# Patient Record
Sex: Male | Born: 1960
Health system: Southern US, Community
[De-identification: ages and names within clinical notes are randomized; demographics above are authoritative.]

## PROBLEM LIST (undated history)

## (undated) DIAGNOSIS — R519 Headache, unspecified: Secondary | ICD-10-CM

## (undated) DIAGNOSIS — W57XXXA Bitten or stung by nonvenomous insect and other nonvenomous arthropods, initial encounter: Secondary | ICD-10-CM

## (undated) DIAGNOSIS — R51 Headache: Secondary | ICD-10-CM

## (undated) DIAGNOSIS — Z87442 Personal history of urinary calculi: Secondary | ICD-10-CM

## (undated) DIAGNOSIS — M1990 Unspecified osteoarthritis, unspecified site: Secondary | ICD-10-CM

## (undated) HISTORY — PX: THROAT SURGERY: SHX803

## (undated) HISTORY — PX: CHOLECYSTECTOMY: SHX55

---

## 2000-04-17 ENCOUNTER — Emergency Department (HOSPITAL_COMMUNITY): Admission: EM | Admit: 2000-04-17 | Discharge: 2000-04-17 | Payer: Self-pay | Admitting: Emergency Medicine

## 2005-08-29 ENCOUNTER — Emergency Department (HOSPITAL_COMMUNITY): Admission: EM | Admit: 2005-08-29 | Discharge: 2005-08-29 | Payer: Self-pay | Admitting: Emergency Medicine

## 2007-11-08 ENCOUNTER — Emergency Department (HOSPITAL_COMMUNITY): Admission: EM | Admit: 2007-11-08 | Discharge: 2007-11-09 | Payer: Self-pay | Admitting: Emergency Medicine

## 2008-10-22 ENCOUNTER — Emergency Department (HOSPITAL_COMMUNITY): Admission: EM | Admit: 2008-10-22 | Discharge: 2008-10-22 | Payer: Self-pay | Admitting: Family Medicine

## 2010-10-04 LAB — POCT URINALYSIS DIP (DEVICE)
Protein, ur: 30 mg/dL — AB
pH: 5 (ref 5.0–8.0)

## 2012-01-19 ENCOUNTER — Encounter (HOSPITAL_COMMUNITY): Payer: Self-pay | Admitting: *Deleted

## 2012-01-19 ENCOUNTER — Emergency Department (HOSPITAL_COMMUNITY): Payer: Self-pay

## 2012-01-19 ENCOUNTER — Emergency Department (HOSPITAL_COMMUNITY)
Admission: EM | Admit: 2012-01-19 | Discharge: 2012-01-19 | Disposition: A | Discharge status: Home / Self Care | Payer: Self-pay | Attending: Emergency Medicine | Admitting: Emergency Medicine

## 2012-01-19 DIAGNOSIS — S335XXA Sprain of ligaments of lumbar spine, initial encounter: Secondary | ICD-10-CM | POA: Insufficient documentation

## 2012-01-19 DIAGNOSIS — IMO0002 Reserved for concepts with insufficient information to code with codable children: Secondary | ICD-10-CM

## 2012-01-19 DIAGNOSIS — M545 Low back pain, unspecified: Secondary | ICD-10-CM | POA: Insufficient documentation

## 2012-01-19 DIAGNOSIS — F17200 Nicotine dependence, unspecified, uncomplicated: Secondary | ICD-10-CM | POA: Insufficient documentation

## 2012-01-19 DIAGNOSIS — W1789XA Other fall from one level to another, initial encounter: Secondary | ICD-10-CM | POA: Insufficient documentation

## 2012-01-19 MED ORDER — KETOROLAC TROMETHAMINE 60 MG/2ML IM SOLN
60.0000 mg | Freq: Once | INTRAMUSCULAR | Status: AC
  Administered 2012-01-19: 60 mg via INTRAMUSCULAR
  Filled 2012-01-19: qty 2

## 2012-01-19 MED ORDER — DIAZEPAM 5 MG PO TABS
10.0000 mg | ORAL_TABLET | Freq: Once | ORAL | Status: AC
  Administered 2012-01-19: 10 mg via ORAL
  Filled 2012-01-19: qty 2

## 2012-01-19 MED ORDER — OXYCODONE-ACETAMINOPHEN 7.5-325 MG PO TABS: 1.0000 | ORAL_TABLET | ORAL | Status: AC | PRN

## 2012-01-19 MED ORDER — DIAZEPAM 5 MG PO TABS: 5.0000 mg | ORAL_TABLET | ORAL | Status: AC | PRN

## 2012-01-19 MED ORDER — OXYCODONE-ACETAMINOPHEN 5-325 MG PO TABS
2.0000 | ORAL_TABLET | Freq: Once | ORAL | Status: AC
  Administered 2012-01-19: 2 via ORAL
  Filled 2012-01-19: qty 2

## 2012-01-19 NOTE — ED Notes (Signed)
Patient given discharge instructions, information, prescriptions, and diet order. Patient states that they adequately understand discharge information given and to return to ED if symptoms return or worsen.     

## 2012-01-19 NOTE — ED Provider Notes (Signed)
History     CSN: [id number]  Arrival date & time 01/19/12  1913   First MD Initiated Contact with Patient 01/19/12 2026      Chief Complaint  Patient presents with  . Fall    > 10 feet from telephone pole    (Consider location/radiation/quality/duration/timing/severity/associated sxs/prior treatment) Patient is a 51 y.o. male presenting with fall. The history is provided by the patient and the spouse.  Fall   Patient fell 10 feet from a telephone pole 3 days ago and landed on his feet on a grassy surface. No loss of consciousness. Denies any head or neck chest or abdomen injury. Complains of pain to his lower lumbar spine described as sharp and worse with walking. Does note some radicular pain down his right leg. No change in bowel or bladder function. Patient is able to ablate without dragging his foot. Has been using over-the-counter medications without relief History reviewed. No pertinent past medical history.  Past Surgical History  Procedure Date  . Shoulder surgery     Removed ligament in L upper arm    History reviewed. No pertinent family history.  History  Substance Use Topics  . Smoking status: Current Everyday Smoker -- 1.5 packs/day    Types: Cigarettes  . Smokeless tobacco: Not on file  . Alcohol Use: Yes     daily      Review of Systems  All other systems reviewed and are negative.    Allergies  Review of patient's allergies indicates no known allergies.  Home Medications  No current outpatient prescriptions on file.  BP 144/79  Pulse 67  Temp 98.3 F (36.8 C) (Oral)  Resp 20  SpO2 98%  Physical Exam  Nursing note and vitals reviewed. Constitutional: He is oriented to person, place, and time. He appears well-developed and well-nourished.  Non-toxic appearance. No distress.  HENT:  Head: Normocephalic and atraumatic.  Eyes: Conjunctivae, EOM and lids are normal. Pupils are equal, round, and reactive to light.  Neck: Normal range of  motion. Neck supple. No tracheal deviation present. No mass present.  Cardiovascular: Normal rate, regular rhythm and normal heart sounds.  Exam reveals no gallop.   No murmur heard. Pulmonary/Chest: Effort normal and breath sounds normal. No stridor. No respiratory distress. He has no decreased breath sounds. He has no wheezes. He has no rhonchi. He has no rales.  Abdominal: Soft. Normal appearance and bowel sounds are normal. He exhibits no distension. There is no tenderness. There is no rebound and no CVA tenderness.  Musculoskeletal: Normal range of motion. He exhibits no edema and no tenderness.       Right shoulder: He exhibits pain and spasm.       Arms: Neurological: He is alert and oriented to person, place, and time. He has normal strength. No cranial nerve deficit or sensory deficit. GCS eye subscore is 4. GCS verbal subscore is 5. GCS motor subscore is 6.  Skin: Skin is warm and dry. No abrasion and no rash noted.  Psychiatric: He has a normal mood and affect. His speech is normal and behavior is normal.    ED Course  Procedures (including critical care time)  Labs Reviewed - No data to display No results found.   No diagnosis found.    MDM  Pt given pain meds and feels better--repeat neuro exam and pt able to ambulate        Toy Baker, MD 01/19/12 2300

## 2012-01-19 NOTE — ED Notes (Signed)
Patient ambulated independently to restroom. Slight limp.

## 2012-01-19 NOTE — ED Notes (Signed)
Pt sts he fell on Thursday from 15 feet high at work. Patient sts he was given pain pill from manager and told to go back to work. Has had pain in his lower back since Thursday, walking with limp and holding his back.

## 2012-01-19 NOTE — ED Notes (Signed)
Pt states he fell from halfway up a telephone pole landing on both feet. Pt was at work at the time. Pt presents w/ c/o lower back pain radiating to both legs, mostly the L leg. Pt c/o pain worse when changing positions and change in mobility. Pt also c/o slight pain in suprapubic area.

## 2016-10-15 ENCOUNTER — Encounter (HOSPITAL_COMMUNITY): Payer: Self-pay | Admitting: Emergency Medicine

## 2016-10-15 ENCOUNTER — Emergency Department (HOSPITAL_COMMUNITY)
Admission: EM | Admit: 2016-10-15 | Discharge: 2016-10-15 | Disposition: A | Discharge status: Home / Self Care | Payer: Self-pay | Attending: Emergency Medicine | Admitting: Emergency Medicine

## 2016-10-15 ENCOUNTER — Emergency Department (HOSPITAL_COMMUNITY): Payer: Self-pay

## 2016-10-15 DIAGNOSIS — F17210 Nicotine dependence, cigarettes, uncomplicated: Secondary | ICD-10-CM | POA: Insufficient documentation

## 2016-10-15 DIAGNOSIS — N3001 Acute cystitis with hematuria: Secondary | ICD-10-CM | POA: Insufficient documentation

## 2016-10-15 DIAGNOSIS — N200 Calculus of kidney: Secondary | ICD-10-CM | POA: Insufficient documentation

## 2016-10-15 LAB — CBC
HCT: 43.5 % (ref 39.0–52.0)
Hemoglobin: 14.4 g/dL (ref 13.0–17.0)
MCH: 30.8 pg (ref 26.0–34.0)
MCHC: 33.1 g/dL (ref 30.0–36.0)
MCV: 93.1 fL (ref 78.0–100.0)
PLATELETS: 331 10*3/uL (ref 150–400)
RBC: 4.67 MIL/uL (ref 4.22–5.81)
RDW: 13.5 % (ref 11.5–15.5)
WBC: 6.1 10*3/uL (ref 4.0–10.5)

## 2016-10-15 LAB — BASIC METABOLIC PANEL
ANION GAP: 8 (ref 5–15)
BUN: 13 mg/dL (ref 6–20)
CALCIUM: 9.3 mg/dL (ref 8.9–10.3)
CO2: 27 mmol/L (ref 22–32)
CREATININE: 0.98 mg/dL (ref 0.61–1.24)
Chloride: 100 mmol/L — ABNORMAL LOW (ref 101–111)
GFR calc Af Amer: >60 mL/min (ref >60)
GLUCOSE: 98 mg/dL (ref 65–99)
Potassium: 3.5 mmol/L (ref 3.5–5.1)
Sodium: 135 mmol/L (ref 135–145)

## 2016-10-15 LAB — URINALYSIS, ROUTINE W REFLEX MICROSCOPIC
BILIRUBIN URINE: NEGATIVE
Glucose, UA: NEGATIVE mg/dL
Ketones, ur: NEGATIVE mg/dL
NITRITE: NEGATIVE
Protein, ur: 30 mg/dL — AB
SPECIFIC GRAVITY, URINE: 1.011 (ref 1.005–1.030)
Squamous Epithelial / LPF: NONE SEEN
pH: 5 (ref 5.0–8.0)

## 2016-10-15 MED ORDER — ACETAMINOPHEN 325 MG PO TABS
650.0000 mg | ORAL_TABLET | Freq: Once | ORAL | Status: AC
  Administered 2016-10-15: 650 mg via ORAL
  Filled 2016-10-15: qty 2

## 2016-10-15 MED ORDER — OXYCODONE-ACETAMINOPHEN 5-325 MG PO TABS: 1.0000 | ORAL_TABLET | Freq: Four times a day (QID) | ORAL | 0 refills | Status: DC | PRN

## 2016-10-15 MED ORDER — CIPROFLOXACIN HCL 250 MG PO TABS: 250.0000 mg | ORAL_TABLET | Freq: Two times a day (BID) | ORAL | 0 refills | Status: AC

## 2016-10-15 NOTE — ED Provider Notes (Signed)
[facility] DEPT Provider Note   CSN: [id number] Arrival date & time: 10/15/16  1405     History   Chief Complaint Chief Complaint  Patient presents with  . Nephrolithiasis    HPI Jose Brooks is a 56 y.o. male presents today with complaints of right flank pain starting 1 and half week. He reports associated hematuria. He reports his pain as pressure, constant, 10/10. He reports taking percocet with moderate relief.  He states his pain is worse when he has to urinate.  He denies fever, chills, nausea, vomiting, diarrhea. He denies previous hx of kidney stone. He reports he has had his gallbladder removed. He states he was diagnosed with kidney stone Saturday of last week in [place] while he was out of town working. He states he went back to hospital and was told it was not in the right place and he would need further evaluation.   The history is provided by the patient. No language interpreter was used.    History reviewed. No pertinent past medical history.  There are no active problems to display for this patient.   Past Surgical History:  Procedure Laterality Date  . SHOULDER SURGERY     Removed ligament in L upper arm       Home Medications    Prior to Admission medications   Medication Sig Start Date End Date Taking? Authorizing Provider  ciprofloxacin (CIPRO) 250 MG tablet Take 1 tablet (250 mg total) by mouth 2 (two) times daily. 10/15/16 10/18/16  Lyan Moyano Manuel [place], [place]  oxyCODONE-acetaminophen (PERCOCET/ROXICET) 5-325 MG tablet Take 1 tablet by mouth every 6 (six) hours as needed for severe pain. 10/15/16   Ivo Moga Mathews [place], [place]    Family History No family history on file.  Social History Social History  Substance Use Topics  . Smoking status: Current Every Day Smoker    Packs/day: 1.50    Types: Cigarettes  . Smokeless tobacco: Not on file  . Alcohol use Yes     Comment: daily     Allergies   Patient has no known  allergies.   Review of Systems Review of Systems  Constitutional: Negative for chills and fever.  Respiratory: Negative for shortness of breath.   Cardiovascular: Negative for chest pain.  Gastrointestinal: Negative for abdominal pain, diarrhea, nausea and vomiting.  Genitourinary: Positive for flank pain and hematuria. Negative for discharge, penile pain and penile swelling.  All other systems reviewed and are negative.    Physical Exam Updated Vital Signs BP (!) 149/93   Pulse 64   Temp 97.8 F (36.6 C) (Oral)   Resp 18   SpO2 100%   Physical Exam  Constitutional: He appears well-developed and well-nourished.  Well appearing  HENT:  Head: Normocephalic and atraumatic.  Nose: Nose normal.  Mouth/Throat: Oropharynx is clear and moist.  Eyes: Conjunctivae and EOM are normal.  Neck: Normal range of motion.  Cardiovascular: Normal rate, normal heart sounds and intact distal pulses.   No murmur heard. Pulmonary/Chest: Effort normal and breath sounds normal. No respiratory distress. He has no wheezes. He has no rales.  Normal work of breathing. No respiratory distress noted.   Abdominal: Soft. Bowel sounds are normal. There is no tenderness. There is no rebound and no guarding.  Negative murphy's sign. No focal tenderness to Mcburney's point. Positive CVA tenderness to right side. Negative CVA tenderness to left side.   Genitourinary:  Genitourinary Comments: Foley catheter in place. No signs of erythema, discharge. No  evidence of infectious process  Musculoskeletal: Normal range of motion.  Neurological: He is alert.  Skin: Skin is warm. Capillary refill takes less than 2 seconds.  Psychiatric: He has a normal mood and affect. His behavior is normal.  Nursing note and vitals reviewed.    ED Treatments / Results  Labs (all labs ordered are listed, but only abnormal results are displayed) Labs Reviewed  URINALYSIS, ROUTINE W REFLEX MICROSCOPIC - Abnormal; Notable for the  following:       Result Value   APPearance HAZY (*)    Hgb urine dipstick LARGE (*)    Protein, ur 30 (*)    Leukocytes, UA MODERATE (*)    Bacteria, UA RARE (*)    All other components within normal limits  BASIC METABOLIC PANEL - Abnormal; Notable for the following:    Chloride 100 (*)    All other components within normal limits  URINE CULTURE  CBC    EKG  EKG Interpretation None       Radiology Ct Renal Stone Study  Result Date: 10/15/2016 CLINICAL DATA:  Right-sided flank pain and hematuria. Recent stent placement. EXAM: CT ABDOMEN AND PELVIS WITHOUT CONTRAST TECHNIQUE: Multidetector CT imaging of the abdomen and pelvis was performed following the standard protocol without IV contrast. COMPARISON:  None. FINDINGS: Lower chest: Normal Hepatobiliary: Liver appears normal.  Previous cholecystectomy. Pancreas: Normal Spleen: Normal Adrenals/Urinary Tract: Adrenal glands are normal. 2 mm nonobstructing stone in the upper pole the left kidney. Double-J ureteral stent in place on the right, grossly well positioned. Nonobstructing 1 mm stone in the lower pole the right kidney. 4 mm stone in the extra renal pelvis adjacent to the proximal stent. 2 mm stone dependent in the bladder. Catheter in place within the bladder as well. Stomach/Bowel: Normal Vascular/Lymphatic: Aortic atherosclerosis. No aneurysm. IVC is normal. No retroperitoneal mass or lymphadenopathy. Reproductive: Normal Other: No free fluid or air. Musculoskeletal: Chronic lower lumbar degenerative changes. IMPRESSION: Double-J ureteral stent on the right grossly well positioned. 4 mm stone adjacent to the stent in the extra renal pelvis. 2 mm stone in the bladder. Nonobstructing 1 mm stone lower pole right kidney and 2 mm stone upper pole left kidney. Aortic atherosclerosis. Previous cholecystectomy. Lower lumbar degenerative changes. Electronically Signed   By: Nelson Chimes M.D.   On: 10/15/2016 18:15    Procedures Procedures  (including critical care time)  Medications Ordered in ED Medications  acetaminophen (TYLENOL) tablet 650 mg (650 mg Oral Given 10/15/16 1703)     Initial Impression / Assessment and Plan / ED Course  I have reviewed the triage vital signs and the nursing notes.  Pertinent labs & imaging results that were available during my care of the patient were reviewed by me and considered in my medical decision making (see chart for details).    Pt has been diagnosed with a Kidney Stone via CT. Patient's double J ureteral stent on the right side well positioned via CT. There is no evidence of significant hydronephrosis, serum creatine WNL, vitals sign stable and the pt does not have intractable vomiting. Pt will be dc home with pain medications and has been advised to follow up with urologist and keep foley in place until seeing urologist. Urinalysis also showed evidence of possible urinary tract infection. Patient will be given Cipro, and urine culture sent. Patient case discussed with Dr. Vallery Ridge who agrees with assessment and plan. Reasons to immediately return to ED discussed. Patient verbally understood and agreed with assessment  and plan.   Final Clinical Impressions(s) / ED Diagnoses   Final diagnoses:  Nephrolithiasis  Acute cystitis with hematuria    New Prescriptions New Prescriptions   CIPROFLOXACIN (CIPRO) 250 MG TABLET    Take 1 tablet (250 mg total) by mouth 2 (two) times daily.   OXYCODONE-ACETAMINOPHEN (PERCOCET/ROXICET) 5-325 MG TABLET    Take 1 tablet by mouth every 6 (six) hours as needed for severe pain.     [place], [place] 10/15/16 1844    Charlesetta Shanks, MD 10/16/16 0025

## 2016-10-15 NOTE — Discharge Instructions (Signed)
Please take Percocet as needed for severe pain. Please take Cipro twice a day for 3 days. It is important that he stick with the antibiotic regimen and that you complete the course. Please follow up with urology, call tomorrow to make an appointment regarding today's visit.  Contact a health care provider if: You have pain that gets worse or does not get better with medicine. Get help right away if: You have a fever or chills. You develop severe pain. You develop new abdominal pain. You faint. You are unable to urinate.

## 2016-10-15 NOTE — ED Notes (Signed)
Asked PA for update

## 2016-10-15 NOTE — ED Notes (Signed)
Pt stable, ambulatory, states understanding of discharge instructions 

## 2016-10-15 NOTE — ED Triage Notes (Signed)
Pt reports diagnosed with kidney stone and had stent and foley catheter placed in [place] while he was out of town working, states he was told stent was not in the right place and he needed to follow up for further evaluation.

## 2016-10-16 LAB — URINE CULTURE

## 2016-10-22 ENCOUNTER — Other Ambulatory Visit: Payer: Self-pay | Admitting: Urology

## 2016-10-24 ENCOUNTER — Emergency Department (HOSPITAL_COMMUNITY): Payer: Self-pay

## 2016-10-24 ENCOUNTER — Encounter (HOSPITAL_COMMUNITY): Payer: Self-pay | Admitting: *Deleted

## 2016-10-24 ENCOUNTER — Emergency Department (HOSPITAL_COMMUNITY)
Admission: EM | Admit: 2016-10-24 | Discharge: 2016-10-24 | Disposition: A | Discharge status: Home / Self Care | Payer: Self-pay | Attending: Emergency Medicine | Admitting: Emergency Medicine

## 2016-10-24 DIAGNOSIS — N390 Urinary tract infection, site not specified: Secondary | ICD-10-CM | POA: Insufficient documentation

## 2016-10-24 DIAGNOSIS — R319 Hematuria, unspecified: Secondary | ICD-10-CM | POA: Insufficient documentation

## 2016-10-24 DIAGNOSIS — N320 Bladder-neck obstruction: Secondary | ICD-10-CM | POA: Insufficient documentation

## 2016-10-24 DIAGNOSIS — F17210 Nicotine dependence, cigarettes, uncomplicated: Secondary | ICD-10-CM | POA: Insufficient documentation

## 2016-10-24 LAB — CBC WITH DIFFERENTIAL/PLATELET
Basophils Absolute: 0 10*3/uL (ref 0.0–0.1)
Basophils Relative: 0 %
Eosinophils Absolute: 0.1 10*3/uL (ref 0.0–0.7)
Eosinophils Relative: 0 %
HEMATOCRIT: 44.7 % (ref 39.0–52.0)
HEMOGLOBIN: 15.1 g/dL (ref 13.0–17.0)
LYMPHS ABS: 2 10*3/uL (ref 0.7–4.0)
LYMPHS PCT: 15 %
MCH: 30.6 pg (ref 26.0–34.0)
MCHC: 33.8 g/dL (ref 30.0–36.0)
MCV: 90.5 fL (ref 78.0–100.0)
MONO ABS: 3.3 10*3/uL — AB (ref 0.1–1.0)
MONOS PCT: 25 %
NEUTROS ABS: 7.9 10*3/uL — AB (ref 1.7–7.7)
NEUTROS PCT: 60 %
Platelets: 271 10*3/uL (ref 150–400)
RBC: 4.94 MIL/uL (ref 4.22–5.81)
RDW: 13.3 % (ref 11.5–15.5)
WBC: 13.3 10*3/uL — ABNORMAL HIGH (ref 4.0–10.5)

## 2016-10-24 LAB — URINALYSIS, ROUTINE W REFLEX MICROSCOPIC
Bilirubin Urine: NEGATIVE
GLUCOSE, UA: NEGATIVE mg/dL
Ketones, ur: NEGATIVE mg/dL
NITRITE: NEGATIVE
PROTEIN: 30 mg/dL — AB
Specific Gravity, Urine: 1.013 (ref 1.005–1.030)
pH: 6 (ref 5.0–8.0)

## 2016-10-24 LAB — I-STAT CG4 LACTIC ACID, ED
Lactic Acid, Venous: 1.25 mmol/L (ref 0.5–1.9)
Lactic Acid, Venous: 0.85 mmol/L (ref 0.5–1.9)

## 2016-10-24 LAB — COMPREHENSIVE METABOLIC PANEL
ALK PHOS: 69 U/L (ref 38–126)
ALT: 12 U/L — AB (ref 17–63)
AST: 16 U/L (ref 15–41)
Albumin: 3.5 g/dL (ref 3.5–5.0)
Anion gap: 8 (ref 5–15)
BILIRUBIN TOTAL: 0.9 mg/dL (ref 0.3–1.2)
BUN: 10 mg/dL (ref 6–20)
CO2: 27 mmol/L (ref 22–32)
CREATININE: 1.19 mg/dL (ref 0.61–1.24)
Calcium: 9.2 mg/dL (ref 8.9–10.3)
Chloride: 95 mmol/L — ABNORMAL LOW (ref 101–111)
Glucose, Bld: 95 mg/dL (ref 65–99)
Potassium: 4.1 mmol/L (ref 3.5–5.1)
Sodium: 130 mmol/L — ABNORMAL LOW (ref 135–145)
TOTAL PROTEIN: 7.8 g/dL (ref 6.5–8.1)

## 2016-10-24 MED ORDER — ACETAMINOPHEN 325 MG PO TABS
ORAL_TABLET | ORAL | Status: AC
  Filled 2016-10-24: qty 2

## 2016-10-24 MED ORDER — DOXYCYCLINE HYCLATE 100 MG PO TABS
100.0000 mg | ORAL_TABLET | Freq: Once | ORAL | Status: AC
  Administered 2016-10-24: 100 mg via ORAL
  Filled 2016-10-24: qty 1

## 2016-10-24 MED ORDER — SULFAMETHOXAZOLE-TRIMETHOPRIM 800-160 MG PO TABS: 1.0000 | ORAL_TABLET | Freq: Two times a day (BID) | ORAL | 0 refills | Status: AC

## 2016-10-24 MED ORDER — OXYCODONE-ACETAMINOPHEN 5-325 MG PO TABS
1.0000 | ORAL_TABLET | ORAL | Status: DC | PRN
  Administered 2016-10-24: 1 via ORAL
  Filled 2016-10-24: qty 1

## 2016-10-24 MED ORDER — DEXTROSE 5 % IV SOLN
1.0000 g | INTRAVENOUS | Status: DC
  Administered 2016-10-24: 1 g via INTRAVENOUS
  Filled 2016-10-24: qty 10

## 2016-10-24 MED ORDER — DOXYCYCLINE HYCLATE 50 MG PO CAPS: 50.0000 mg | ORAL_CAPSULE | Freq: Two times a day (BID) | ORAL | 0 refills | Status: AC

## 2016-10-24 MED ORDER — ACETAMINOPHEN 325 MG PO TABS
650.0000 mg | ORAL_TABLET | Freq: Once | ORAL | Status: AC
  Administered 2016-10-24: 650 mg via ORAL

## 2016-10-24 MED ORDER — HYDROCODONE-ACETAMINOPHEN 5-325 MG PO TABS: 1.0000 | ORAL_TABLET | Freq: Four times a day (QID) | ORAL | 0 refills | Status: DC | PRN

## 2016-10-24 NOTE — ED Provider Notes (Signed)
[facility] DEPT Provider Note   CSN: [id number] Arrival date & time: 10/24/16  1717     History   Chief Complaint Chief Complaint  Patient presents with  . Fever    HPI Jose Brooks is a 56 y.o. male with history of ureteral stent and nephrolithiasis who presents with 1 day of fevers, chills, dysuria, and straining to micturate.  He was seen in the ED 1 week ago with nephrolithiasis.  Discharged home with foley catheter, pain meds, and urology follow-up.  Saw urologist at [facility] and was scheduled for a procedure.  As instructed, he returned yesterday for a nurse visit and his foley catheter was removed.  Since that time he has had worsening dysuria and has been straining to void small volumes.  Has had fevers to 101 oral at home last night, and intermittent chills for the past week.  Completed course of Cipro a few days ago; had been taking once a day instead of twice a day for the first part of the course.  He is scheduled for cystoscopy and stent exchange on 5/7 by urologist Dr Pilar Jarvis.  HPI  History reviewed. No pertinent past medical history.  There are no active problems to display for this patient.   Past Surgical History:  Procedure Laterality Date  . CHOLECYSTECTOMY    . SHOULDER SURGERY     Removed ligament in L upper arm      Home Medications    Prior to Admission medications   Medication Sig Start Date End Date Taking? Authorizing Provider  doxycycline (VIBRAMYCIN) 50 MG capsule Take 1 capsule (50 mg total) by mouth 2 (two) times daily. 10/24/16 11/03/16  Minus Liberty, MD  HYDROcodone-acetaminophen (NORCO) 5-325 MG tablet Take 1 tablet by mouth every 6 (six) hours as needed for moderate pain. 10/24/16   Minus Liberty, MD  oxyCODONE-acetaminophen (PERCOCET/ROXICET) 5-325 MG tablet Take 1 tablet by mouth every 6 (six) hours as needed for severe pain. 10/15/16   Francisco Manuel [place], [place]  sulfamethoxazole-trimethoprim (BACTRIM DS,SEPTRA DS) 800-160  MG tablet Take 1 tablet by mouth 2 (two) times daily. 10/24/16 11/03/16  Minus Liberty, MD    Family History Family History  Problem Relation Age of Onset  . Nephrolithiasis Brother     Social History Social History  Substance Use Topics  . Smoking status: Current Every Day Smoker    Packs/day: 1.50    Types: Cigarettes  . Smokeless tobacco: Never Used  . Alcohol use Yes     Comment: daily     Allergies   Patient has no known allergies.   Review of Systems Review of Systems  Constitutional: Positive for chills and fever.  HENT: Negative for congestion and rhinorrhea.   Respiratory: Negative for cough, chest tightness and shortness of breath.   Cardiovascular: Negative for chest pain and palpitations.  Gastrointestinal: Positive for constipation. Negative for diarrhea, nausea and vomiting.  Genitourinary: Positive for difficulty urinating, dysuria, flank pain and hematuria.  Musculoskeletal: Negative for arthralgias and myalgias.  Neurological: Negative for dizziness and headaches.     Physical Exam Updated Vital Signs BP (!) 153/84   Pulse 88   Temp 98.7 F (37.1 C) (Oral)   Resp 20   Ht 5\' 11"  (1.803 m)   Wt 70.3 kg   SpO2 99%   BMI 21.62 kg/m   Physical Exam  Constitutional: He is oriented to person, place, and time. He appears well-developed and well-nourished. No distress.  HENT:  Mouth/Throat: Oropharynx is clear and  moist.  Eyes: Conjunctivae are normal. No scleral icterus.  Neck: Normal range of motion. Neck supple.  Cardiovascular: Normal rate and regular rhythm.   Pulmonary/Chest: Effort normal and breath sounds normal.  Abdominal: Soft. He exhibits no distension. There is no tenderness.  Genitourinary:  Genitourinary Comments: R > L CVA tenderness No SP tenderness  Musculoskeletal: He exhibits no edema or tenderness.  Neurological: He is alert and oriented to person, place, and time.  Skin: Skin is warm and dry.  Psychiatric: He has a  normal mood and affect. His behavior is normal.     ED Treatments / Results  Labs (all labs ordered are listed, but only abnormal results are displayed) Labs Reviewed  COMPREHENSIVE METABOLIC PANEL - Abnormal; Notable for the following:       Result Value   Sodium 130 (*)    Chloride 95 (*)    ALT 12 (*)    All other components within normal limits  CBC WITH DIFFERENTIAL/PLATELET - Abnormal; Notable for the following:    WBC 13.3 (*)    Neutro Abs 7.9 (*)    Monocytes Absolute 3.3 (*)    All other components within normal limits  URINALYSIS, ROUTINE W REFLEX MICROSCOPIC - Abnormal; Notable for the following:    APPearance HAZY (*)    Hgb urine dipstick LARGE (*)    Protein, ur 30 (*)    Leukocytes, UA SMALL (*)    Bacteria, UA RARE (*)    Squamous Epithelial / LPF 0-5 (*)    All other components within normal limits  CULTURE, BLOOD (ROUTINE X 2)  CULTURE, BLOOD (ROUTINE X 2)  URINE CULTURE  I-STAT CG4 LACTIC ACID, ED  I-STAT CG4 LACTIC ACID, ED    EKG  EKG Interpretation None       Radiology Dg Chest 2 View  Result Date: 10/24/2016 CLINICAL DATA:  Fever.  Neurology symptoms. EXAM: CHEST  2 VIEW COMPARISON:  08/29/2005 FINDINGS: The heart size and mediastinal contours are within normal limits. Both lungs are clear. No pleural effusion or pneumothorax. The visualized skeletal structures are unremarkable. IMPRESSION: No active cardiopulmonary disease. Electronically Signed   By: Lajean Manes M.D.   On: 10/24/2016 18:38    Procedures Procedures (including critical care time)  Medications Ordered in ED Medications  acetaminophen (TYLENOL) 325 MG tablet (not administered)  oxyCODONE-acetaminophen (PERCOCET/ROXICET) 5-325 MG per tablet 1 tablet (1 tablet Oral Given 10/24/16 2004)  cefTRIAXone (ROCEPHIN) 1 g in dextrose 5 % 50 mL IVPB (0 g Intravenous Stopped 10/24/16 2217)  acetaminophen (TYLENOL) tablet 650 mg (650 mg Oral Given 10/24/16 1820)  doxycycline (VIBRA-TABS)  tablet 100 mg (100 mg Oral Given 10/24/16 2217)     Initial Impression / Assessment and Plan / ED Course  I have reviewed the triage vital signs and the nursing notes.  Pertinent labs & imaging results that were available during my care of the patient were reviewed by me and considered in my medical decision making (see chart for details).  Patient with known nephrolithiasis and bladder stones and worsening dysuria, fevers, and straining to void after foley catheter was removed yesterday.  Overall consistent with calculous bladder outlet obstruction and possible UTI with fevers and mild leukocytosis.  Renal function is normal, no lactic acidosis, not septic appearing and hemodynamically stable. -Check bladder scan, anticipate placing foley -Pain management -urology consult -ceftriaxone  Clinical Course as of Oct 25 2246  Wed Oct 24, 2016  1928 Discussed case on phone with urologist Dr  Dahlstedt.  Recommend repeat urine culture, place 87F foley, oral Abx, and discharge if stable.  [facility] will call pt tomorrow to arrange follow-up and reschedule procedure.  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  Will add doxycycline to cover additional possible tick-borne illness.  [MO]  2053 UA with pyuria, hematuria, bacteruria consistent with infection  [MO]  2247 Feels relief of pain and pressure with foley in place.  Hungry, ready to go home.  [MO]    Clinical Course User Index [MO] Minus Liberty, MD     Final Clinical Impressions(s) / ED Diagnoses   Final diagnoses:  Bladder outlet obstruction  Urinary tract infection with hematuria, site unspecified   UTI with concern for bladder outlet obstruction in patient with known calculi.  Discharge home with oral antibiotics, foley catheter, and close urology follow-up.   New Prescriptions New Prescriptions   DOXYCYCLINE (VIBRAMYCIN) 50 MG  CAPSULE    Take 1 capsule (50 mg total) by mouth 2 (two) times daily.   HYDROCODONE-ACETAMINOPHEN (NORCO) 5-325 MG TABLET    Take 1 tablet by mouth every 6 (six) hours as needed for moderate pain.   SULFAMETHOXAZOLE-TRIMETHOPRIM (BACTRIM DS,SEPTRA DS) 800-160 MG TABLET    Take 1 tablet by mouth 2 (two) times daily.     Minus Liberty, MD 10/24/16 2248    Duffy Bruce, MD 10/26/16 [phone]

## 2016-10-24 NOTE — ED Notes (Signed)
Pt noticed fever last night, sts its been on and off for about a week.

## 2016-10-24 NOTE — ED Notes (Signed)
Pt sts he has a stent placed in his right kidney due to kidney stones. Pt scheduled for surgery on Monday. Pt not sure exactly what kind of surgery but they are to remove the stones. Pt had catheter placed April 13th along with stent and removed yesterday.

## 2016-10-24 NOTE — ED Notes (Signed)
Bladder scan 434 mL.

## 2016-10-24 NOTE — ED Notes (Signed)
This RN and Charles EMT at bedside to insert foley

## 2016-10-24 NOTE — Discharge Instructions (Signed)
I have prescribed you two antibiotics; take each twice daily for the next 10 days.  Leave the foley catheter in place until you see the urologist.  [facility] will call you tomorrow to schedule follow-up.  If you do not hear from them, please call their office.  If you have worsening fevers, chills, back pain, nausea, or are unable to eat and drink please come back to the ED.

## 2016-10-24 NOTE — ED Notes (Signed)
Pt here for eval for kidney stones, pt reports foley removed yesterday by [facility], pt has known stone & urology procedure scheduled for next Monday per pt, pt reports oliguria, dysuria, & fever, A&O x4

## 2016-10-24 NOTE — ED Notes (Signed)
Pt provided with leg bag and education on how to use it. Pt verbalized understanding and has no further questions

## 2016-10-26 LAB — URINE CULTURE: Culture: 60000 — AB

## 2016-10-26 NOTE — Progress Notes (Signed)
Attempted to reach patient on 5/2 and 5/3 and 10/26/2016 at both phone numbers.  Unsuccessful attempts.  Made connie Mabe at [facility] aware of on 10/25/2016 along with 10/26/2016.  Left her a VMM.  I stated in message that patient would need to contact us for preopinfo.

## 2016-10-27 ENCOUNTER — Telehealth: Payer: Self-pay

## 2016-10-27 NOTE — Progress Notes (Signed)
ED Antimicrobial Stewardship Positive Culture Follow Up   Jose Brooks is an 56 y.o. male who presented to [facility] on 10/24/2016 with a chief complaint of  Chief Complaint  Patient presents with  . Fever    Recent Results (from the past 720 hour(s))  Urine culture     Status: Abnormal   Collection Time: 10/15/16  5:02 PM  Result Value Ref Range Status   Specimen Description URINE, CLEAN CATCH  Final   Special Requests NONE  Final   Culture MULTIPLE SPECIES PRESENT, SUGGEST RECOLLECTION (A)  Final   Report Status 10/16/2016 FINAL  Final  Culture, blood (Routine x 2)     Status: None (Preliminary result)   Collection Time: 10/24/16  8:03 PM  Result Value Ref Range Status   Specimen Description BLOOD LEFT FOREARM  Final   Special Requests   Final    BOTTLES DRAWN AEROBIC AND ANAEROBIC Blood Culture adequate volume   Culture NO GROWTH 2 DAYS  Final   Report Status PENDING  Incomplete  Urine culture     Status: Abnormal   Collection Time: 10/24/16  8:03 PM  Result Value Ref Range Status   Specimen Description URINE, CLEAN CATCH  Final   Special Requests NONE  Final   Culture 60,000 COLONIES/mL ENTEROCOCCUS FAECALIS (A)  Final   Report Status 10/26/2016 FINAL  Final   Organism ID, Bacteria ENTEROCOCCUS FAECALIS (A)  Final      Susceptibility   Enterococcus faecalis - MIC*    AMPICILLIN <=2 SENSITIVE Sensitive     LEVOFLOXACIN 1 SENSITIVE Sensitive     NITROFURANTOIN <=16 SENSITIVE Sensitive     VANCOMYCIN 2 SENSITIVE Sensitive     * 60,000 COLONIES/mL ENTEROCOCCUS FAECALIS  Culture, blood (Routine x 2)     Status: None (Preliminary result)   Collection Time: 10/24/16  8:18 PM  Result Value Ref Range Status   Specimen Description BLOOD LEFT FOREARM  Final   Special Requests   Final    BOTTLES DRAWN AEROBIC AND ANAEROBIC Blood Culture adequate volume   Culture NO GROWTH 2 DAYS  Final   Report Status PENDING  Incomplete    [x]  Treated with doxy and bactrim, organism  resistant to prescribed antimicrobial []  Patient discharged originally without antimicrobial agent and treatment is now indicated  New antibiotic prescription: dc doxy, bactrim. Start levofloxacin 250 mg qd x 10 days  ED Provider: Margarita Mail, PA-C  Wynell Balloon 10/27/2016, 8:49 AM Infectious Diseases Pharmacist Phone# [phone]

## 2016-10-27 NOTE — Telephone Encounter (Signed)
Post ED Visit - Positive Culture Follow-up: Unsuccessful Patient Follow-up  Culture assessed and recommendations reviewed by:  []  Elenor Quinones, Pharm.D. []  Heide Guile, Pharm.D., BCPS AQ-ID []  Parks Neptune, Pharm.D., BCPS []  Alycia Rossetti, Pharm.D., BCPS []  [place], Pharm.D., BCPS, AAHIVP []  Legrand Como, Pharm.D., BCPS, AAHIVP []  Salome Arnt, PharmD, BCPS []  Dimitri Ped, PharmD, BCPS []  Vincenza Hews, PharmD, BCPS Willis Modena Pharm D Positive urine culture  []  Patient discharged without antimicrobial prescription and treatment is now indicated [x]  Organism is resistant to prescribed ED discharge antimicrobial []  Patient with positive blood cultures   Unable to contact patient after 3 attempts, letter will be sent to address on file  Genia Del 10/27/2016, 10:10 AM

## 2016-10-29 ENCOUNTER — Encounter (HOSPITAL_COMMUNITY): Admission: RE | Payer: Self-pay | Source: Ambulatory Visit

## 2016-10-29 ENCOUNTER — Ambulatory Visit (HOSPITAL_COMMUNITY): Admission: RE | Admit: 2016-10-29 | Payer: Self-pay | Source: Ambulatory Visit | Admitting: Urology

## 2016-10-29 LAB — CULTURE, BLOOD (ROUTINE X 2)
CULTURE: NO GROWTH
CULTURE: NO GROWTH
SPECIAL REQUESTS: ADEQUATE
Special Requests: ADEQUATE

## 2016-10-29 SURGERY — CYSTOSCOPY/URETEROSCOPY/HOLMIUM LASER/STENT PLACEMENT
Anesthesia: General | Laterality: Right

## 2016-11-13 ENCOUNTER — Other Ambulatory Visit: Payer: Self-pay | Admitting: Urology

## 2016-11-23 NOTE — Progress Notes (Signed)
CXR 10-24-16 epic

## 2016-11-23 NOTE — Patient Instructions (Addendum)
Jose Brooks  11/23/2016   Your procedure is scheduled on: 11-28-16  Report to [facility] Main  Entrance Take [place]  elevators to 3rd floor to  [place] at 1015AM.  Call this number if you have problems the morning of surgery [phone]    Remember: ONLY 1 PERSON MAY GO WITH YOU TO SHORT STAY TO GET  READY MORNING OF [place].  Do not eat food or drink liquids :After Midnight.     Take these medicines the morning of surgery with A SIP OF WATER: none                                You may not have any metal on your body including hair pins and              piercings  Do not wear jewelry, make-up, lotions, powders or perfumes, deodorant             Do not wear nail polish.  Do not shave  48 hours prior to surgery.              Men may shave face and neck.   Do not bring valuables to the hospital. [place].  Contacts, dentures or bridgework may not be worn into surgery.      Patients discharged the day of surgery will not be allowed to drive home.  Name and phone number of your driver:  Special Instructions: N/A              Please read over the following fact sheets you were given: _____________________________________________________________________             [facility] - Preparing for Surgery Before surgery, you can play an important role.  Because skin is not sterile, your skin needs to be as free of germs as possible.  You can reduce the number of germs on your skin by washing with CHG (chlorahexidine gluconate) soap before surgery.  CHG is an antiseptic cleaner which kills germs and bonds with the skin to continue killing germs even after washing. Please DO NOT use if you have an allergy to CHG or antibacterial soaps.  If your skin becomes reddened/irritated stop using the CHG and inform your nurse when you arrive at Short Stay. Do not shave (including legs and underarms) for at  least 48 hours prior to the first CHG shower.  You may shave your face/neck. Please follow these instructions carefully:  1.  Shower with CHG Soap the night before surgery and the  morning of Surgery.  2.  If you choose to wash your hair, wash your hair first as usual with your  normal  shampoo.  3.  After you shampoo, rinse your hair and body thoroughly to remove the  shampoo.                           4.  Use CHG as you would any other liquid soap.  You can apply chg directly  to the skin and wash                       Gently with a  scrungie or clean washcloth.  5.  Apply the CHG Soap to your body ONLY FROM THE NECK DOWN.   Do not use on face/ open                           Wound or open sores. Avoid contact with eyes, ears mouth and genitals (private parts).                       Wash face,  Genitals (private parts) with your normal soap.             6.  Wash thoroughly, paying special attention to the area where your surgery  will be performed.  7.  Thoroughly rinse your body with warm water from the neck down.  8.  DO NOT shower/wash with your normal soap after using and rinsing off  the CHG Soap.                9.  Pat yourself dry with a clean towel.            10.  Wear clean pajamas.            11.  Place clean sheets on your bed the night of your first shower and do not  sleep with pets. Day of Surgery : Do not apply any lotions/deodorants the morning of surgery.  Please wear clean clothes to the hospital/surgery center.  FAILURE TO FOLLOW THESE INSTRUCTIONS MAY RESULT IN THE CANCELLATION OF YOUR SURGERY PATIENT SIGNATURE_________________________________  NURSE SIGNATURE__________________________________  ________________________________________________________________________

## 2016-11-26 ENCOUNTER — Encounter (HOSPITAL_COMMUNITY): Payer: Self-pay

## 2016-11-26 ENCOUNTER — Encounter (INDEPENDENT_AMBULATORY_CARE_PROVIDER_SITE_OTHER): Payer: Self-pay

## 2016-11-26 ENCOUNTER — Encounter (HOSPITAL_COMMUNITY)
Admission: RE | Admit: 2016-11-26 | Discharge: 2016-11-26 | Disposition: A | Discharge status: Home / Self Care | Payer: Self-pay | Source: Ambulatory Visit | Attending: Urology | Admitting: Urology

## 2016-11-26 DIAGNOSIS — N201 Calculus of ureter: Secondary | ICD-10-CM | POA: Insufficient documentation

## 2016-11-26 DIAGNOSIS — Z01812 Encounter for preprocedural laboratory examination: Secondary | ICD-10-CM | POA: Insufficient documentation

## 2016-11-26 HISTORY — DX: Bitten or stung by nonvenomous insect and other nonvenomous arthropods, initial encounter: W57.XXXA

## 2016-11-26 HISTORY — DX: Headache: R51

## 2016-11-26 HISTORY — DX: Unspecified osteoarthritis, unspecified site: M19.90

## 2016-11-26 HISTORY — DX: Headache, unspecified: R51.9

## 2016-11-26 HISTORY — DX: Personal history of urinary calculi: Z87.442

## 2016-11-26 LAB — BASIC METABOLIC PANEL
Anion gap: 7 (ref 5–15)
BUN: 14 mg/dL (ref 6–20)
CO2: 27 mmol/L (ref 22–32)
CREATININE: 0.95 mg/dL (ref 0.61–1.24)
Calcium: 8.7 mg/dL — ABNORMAL LOW (ref 8.9–10.3)
Chloride: 104 mmol/L (ref 101–111)
GFR calc Af Amer: >60 mL/min (ref >60)
GFR calc non Af Amer: >60 mL/min (ref >60)
GLUCOSE: 75 mg/dL (ref 65–99)
Potassium: 4.1 mmol/L (ref 3.5–5.1)
SODIUM: 138 mmol/L (ref 135–145)

## 2016-11-26 NOTE — Progress Notes (Signed)
RN spoke with Sharyn Lull RN at [facility], informed her of patient refusing stent exchange; he states, "it made me feel worse since it got put in like I have the flu all the time" . Sharyn Lull RN states she will update Pilar Jarvis about this. RN will indicate on front sheet for short stay as an [place]

## 2016-11-26 NOTE — Progress Notes (Signed)
CBCdiff 11-23-16 on chart

## 2016-11-28 ENCOUNTER — Ambulatory Visit (HOSPITAL_COMMUNITY): Payer: Self-pay | Admitting: Registered Nurse

## 2016-11-28 ENCOUNTER — Ambulatory Visit (HOSPITAL_COMMUNITY): Payer: Self-pay

## 2016-11-28 ENCOUNTER — Encounter (HOSPITAL_COMMUNITY)
Admission: RE | Disposition: A | Discharge status: Home / Self Care | Payer: Self-pay | Source: Ambulatory Visit | Attending: Urology

## 2016-11-28 ENCOUNTER — Ambulatory Visit (HOSPITAL_COMMUNITY)
Admission: RE | Admit: 2016-11-28 | Discharge: 2016-11-28 | Disposition: A | Discharge status: Home / Self Care | Payer: Self-pay | Source: Ambulatory Visit | Attending: Urology | Admitting: Urology

## 2016-11-28 ENCOUNTER — Encounter (HOSPITAL_COMMUNITY): Payer: Self-pay | Admitting: *Deleted

## 2016-11-28 DIAGNOSIS — F17210 Nicotine dependence, cigarettes, uncomplicated: Secondary | ICD-10-CM | POA: Insufficient documentation

## 2016-11-28 DIAGNOSIS — N201 Calculus of ureter: Secondary | ICD-10-CM | POA: Insufficient documentation

## 2016-11-28 DIAGNOSIS — C651 Malignant neoplasm of right renal pelvis: Secondary | ICD-10-CM | POA: Insufficient documentation

## 2016-11-28 HISTORY — PX: CYSTOSCOPY/URETEROSCOPY/HOLMIUM LASER/STENT PLACEMENT: SHX6546

## 2016-11-28 SURGERY — CYSTOSCOPY/URETEROSCOPY/HOLMIUM LASER/STENT PLACEMENT
Anesthesia: General | Laterality: Right

## 2016-11-28 MED ORDER — LEVOFLOXACIN IN D5W 500 MG/100ML IV SOLN
500.0000 mg | Freq: Once | INTRAVENOUS | Status: AC
  Administered 2016-11-28: 500 mg via INTRAVENOUS
  Filled 2016-11-28: qty 100

## 2016-11-28 MED ORDER — FENTANYL CITRATE (PF) 100 MCG/2ML IJ SOLN
25.0000 ug | INTRAMUSCULAR | Status: DC | PRN
  Administered 2016-11-28 (×2): 50 ug via INTRAVENOUS

## 2016-11-28 MED ORDER — FENTANYL CITRATE (PF) 100 MCG/2ML IJ SOLN
INTRAMUSCULAR | Status: AC
  Filled 2016-11-28: qty 2

## 2016-11-28 MED ORDER — BELLADONNA ALKALOIDS-OPIUM 16.2-60 MG RE SUPP
1.0000 | RECTAL | Status: AC
  Administered 2016-11-28: 1 via RECTAL

## 2016-11-28 MED ORDER — PROPOFOL 10 MG/ML IV BOLUS
INTRAVENOUS | Status: DC | PRN
  Administered 2016-11-28: 200 mg via INTRAVENOUS

## 2016-11-28 MED ORDER — LACTATED RINGERS IV SOLN
INTRAVENOUS | Status: DC
  Administered 2016-11-28: 11:00:00 via INTRAVENOUS

## 2016-11-28 MED ORDER — MEPERIDINE HCL 50 MG/ML IJ SOLN: 6.2500 mg | INTRAMUSCULAR | Status: DC | PRN

## 2016-11-28 MED ORDER — PROPOFOL 10 MG/ML IV BOLUS
INTRAVENOUS | Status: AC
  Filled 2016-11-28: qty 20

## 2016-11-28 MED ORDER — ONDANSETRON HCL 4 MG/2ML IJ SOLN
INTRAMUSCULAR | Status: AC
  Filled 2016-11-28: qty 2

## 2016-11-28 MED ORDER — IOHEXOL 300 MG/ML  SOLN
INTRAMUSCULAR | Status: DC | PRN
  Administered 2016-11-28: 8 mL via URETHRAL

## 2016-11-28 MED ORDER — LACTATED RINGERS IV SOLN: INTRAVENOUS | Status: DC

## 2016-11-28 MED ORDER — LIDOCAINE 2% (20 MG/ML) 5 ML SYRINGE
INTRAMUSCULAR | Status: DC | PRN
  Administered 2016-11-28: 100 mg via INTRAVENOUS

## 2016-11-28 MED ORDER — OXYCODONE HCL 5 MG/5ML PO SOLN
5.0000 mg | Freq: Once | ORAL | Status: DC | PRN
  Filled 2016-11-28: qty 5

## 2016-11-28 MED ORDER — FENTANYL CITRATE (PF) 100 MCG/2ML IJ SOLN
INTRAMUSCULAR | Status: DC | PRN
  Administered 2016-11-28: 50 ug via INTRAVENOUS
  Administered 2016-11-28 (×3): 25 ug via INTRAVENOUS
  Administered 2016-11-28: 75 ug via INTRAVENOUS

## 2016-11-28 MED ORDER — LIDOCAINE 2% (20 MG/ML) 5 ML SYRINGE
INTRAMUSCULAR | Status: AC
  Filled 2016-11-28: qty 5

## 2016-11-28 MED ORDER — OXYCODONE HCL 5 MG PO TABS: 5.0000 mg | ORAL_TABLET | Freq: Once | ORAL | Status: DC | PRN

## 2016-11-28 MED ORDER — MIDAZOLAM HCL 5 MG/5ML IJ SOLN
INTRAMUSCULAR | Status: DC | PRN
  Administered 2016-11-28: 2 mg via INTRAVENOUS

## 2016-11-28 MED ORDER — CEFAZOLIN SODIUM-DEXTROSE 2-4 GM/100ML-% IV SOLN
2.0000 g | INTRAVENOUS | Status: AC
  Administered 2016-11-28: 2 g via INTRAVENOUS
  Filled 2016-11-28: qty 100

## 2016-11-28 MED ORDER — OXYCODONE-ACETAMINOPHEN 5-325 MG PO TABS
1.0000 | ORAL_TABLET | ORAL | Status: AC
  Administered 2016-11-28: 1 via ORAL
  Filled 2016-11-28: qty 1

## 2016-11-28 MED ORDER — PROMETHAZINE HCL 25 MG/ML IJ SOLN: 6.2500 mg | INTRAMUSCULAR | Status: DC | PRN

## 2016-11-28 MED ORDER — MIDAZOLAM HCL 2 MG/2ML IJ SOLN
INTRAMUSCULAR | Status: AC
  Filled 2016-11-28: qty 2

## 2016-11-28 MED ORDER — ONDANSETRON HCL 4 MG/2ML IJ SOLN
INTRAMUSCULAR | Status: DC | PRN
  Administered 2016-11-28: 4 mg via INTRAVENOUS

## 2016-11-28 MED ORDER — OXYCODONE-ACETAMINOPHEN 5-325 MG PO TABS: 1.0000 | ORAL_TABLET | ORAL | 0 refills | Status: DC | PRN

## 2016-11-28 MED ORDER — OXYCODONE-ACETAMINOPHEN 5-325 MG PO TABS
1.0000 | ORAL_TABLET | ORAL | Status: DC | PRN
  Administered 2016-11-28: 1 via ORAL
  Filled 2016-11-28: qty 1

## 2016-11-28 MED ORDER — SODIUM CHLORIDE 0.9 % IR SOLN
Status: DC | PRN
  Administered 2016-11-28: 12000 mL via INTRAVESICAL

## 2016-11-28 SURGICAL SUPPLY — 23 items
BAG URO CATCHER STRL LF (MISCELLANEOUS) ×2 IMPLANT
BASKET ZERO TIP NITINOL 2.4FR (BASKET) ×2 IMPLANT
BSKT STON RTRVL ZERO TP 2.4FR (BASKET) ×1
CATH URET 5FR 28IN OPEN ENDED (CATHETERS) IMPLANT
CATH URET DUAL LUMEN 6-10FR 50 (CATHETERS) ×2 IMPLANT
CLOTH BEACON ORANGE TIMEOUT ST (SAFETY) ×2 IMPLANT
COVER SURGICAL LIGHT HANDLE (MISCELLANEOUS) IMPLANT
DRAPE C-ARM 42X120 X-RAY (DRAPES) ×2 IMPLANT
FIBER LASER FLEXIVA 365 (UROLOGICAL SUPPLIES) ×2 IMPLANT
FIBER LASER TRAC TIP (UROLOGICAL SUPPLIES) IMPLANT
GLOVE BIO SURGEON STRL SZ7.5 (GLOVE) ×2 IMPLANT
GOWN STRL REUS W/TWL LRG LVL3 (GOWN DISPOSABLE) ×2 IMPLANT
GUIDEWIRE ANG ZIPWIRE 038X150 (WIRE) IMPLANT
GUIDEWIRE STR DUAL SENSOR (WIRE) ×4 IMPLANT
IV NS 1000ML (IV SOLUTION) ×2
IV NS 1000ML BAXH (IV SOLUTION) ×1 IMPLANT
MANIFOLD NEPTUNE II (INSTRUMENTS) ×2 IMPLANT
NS IRRIG 1000ML POUR BTL (IV SOLUTION) ×2 IMPLANT
PACK CYSTO (CUSTOM PROCEDURE TRAY) ×2 IMPLANT
PAD TELFA 2X3 NADH STRL (GAUZE/BANDAGES/DRESSINGS) ×2 IMPLANT
SHEATH ACCESS URETERAL 38CM (SHEATH) ×2 IMPLANT
STENT URET 6FRX26 CONTOUR (STENTS) ×2 IMPLANT
TUBING CONNECTING 10 (TUBING) ×2 IMPLANT

## 2016-11-28 NOTE — H&P (Signed)
CC: I have ureteral stone.  HPI: Jose Brooks is a 56 year-old male patient who is here for ureteral stone.  The problem is on the right side. This is his first kidney stone. He is currently having flank pain, back pain, nausea, and vomiting. He denies having groin pain, fever, and chills. He has not caught a stone in his urine strainer since his symptoms began.   He has had ureteral stent for treatment of his stones in the past.   Patient with recent right ureteral stent placement while on vacation in [place] for proximal 4 mm right ureteral stone. Recent CT shows this stone as well as stent in correct position. He also had a foley catheter placed at the time of surgery due to post op retention. Never had a stone before. No baseline urinary issues. Voids with good stream. Minimal nocturia. Normally feels like his bladder his empty.   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     ALLERGIES: None   MEDICATIONS: Percocet  Advil     GU PSH: None   NON-GU PSH: Cholecystectomy (laparoscopic)    GU PMH: None   NON-GU PMH: None   FAMILY HISTORY: 1 Daughter - Daughter   SOCIAL HISTORY: Marital Status: Divorced Current Smoking Status: Patient smokes. Has smoked since 09/23/1976. Smokes 3 packs per day.   Tobacco Use Assessment Completed: Used Tobacco in last 30 days? Drinks 2 drinks per day.  Drinks 4+ caffeinated drinks per day. Patient's occupation Tour manager.    REVIEW OF SYSTEMS:    GU Review Male:   Patient denies frequent urination, hard to postpone urination, burning/ pain with urination, get up at night to urinate, leakage of urine, stream starts and stops, trouble starting your stream, have to strain to urinate , erection problems, and penile pain.  Gastrointestinal (Upper):   Patient reports nausea. Patient denies vomiting and indigestion/ heartburn.  Gastrointestinal (Lower):   Patient reports diarrhea. Patient denies constipation.   Constitutional:   Patient reports fever, night sweats, weight loss, and fatigue.   Skin:   Patient denies skin rash/ lesion and itching.  Eyes:   Patient denies blurred vision and double vision.  Ears/ Nose/ Throat:   Patient reports sinus problems. Patient denies sore throat.  Hematologic/Lymphatic:   Patient denies swollen glands and easy bruising.  Cardiovascular:   Patient denies leg swelling and chest pains.  Respiratory:   Patient denies cough and shortness of breath.  Endocrine:   Patient denies excessive thirst.  Musculoskeletal:   Patient reports back pain. Patient denies joint pain.  Neurological:   Patient reports headaches. Patient denies dizziness.  Psychologic:   Patient denies depression and anxiety.   VITAL SIGNS:      10/22/2016 03:58 PM  Weight 157 lb / 71.21 kg  Height 70 in / 177.8 cm  BP 155/95 mmHg  Pulse 89 /min  Temperature 98.6 F / 37 C  BMI 22.5 kg/m   MULTI-SYSTEM PHYSICAL EXAMINATION:    Constitutional: Well-nourished. No physical deformities. Normally developed. Good grooming.  Neck: Neck symmetrical, not swollen. Normal tracheal position.  Respiratory: No labored breathing, no use of accessory muscles.   Cardiovascular: Normal temperature, normal extremity pulses, no swelling, no varicosities.  Lymphatic: No enlargement of neck, axillae, groin.  Skin: No paleness, no jaundice, no cyanosis. No lesion, no ulcer, no rash.  Neurologic / Psychiatric: Oriented to time, oriented to place, oriented to person. No depression, no anxiety, no agitation.  Gastrointestinal: No mass, no tenderness, no rigidity, non obese abdomen.  Eyes: Normal conjunctivae. Normal eyelids.  Ears, Nose, Mouth, and Throat: Left ear no scars, no lesions, no masses. Right ear no scars, no lesions, no masses. Nose no scars, no lesions, no masses. Normal hearing. Normal lips.  Musculoskeletal: Normal gait and station of head and neck.     PAST DATA REVIEWED:  Source Of History:   Patient  X-Ray Review: [facility] Protocol: Reviewed Films. Reviewed Report. Discussed With Patient.     PROCEDURES: None   ASSESSMENT:      ICD-10 Details  1 GU:   Ureteral calculus - N20.1    PLAN:           Orders Labs Urine Culture          Schedule Return Visit/Planned Activity: Other See Visit Notes - Nurse Visit             Note: AM visit with nurse for trial of void on Tuesday Oct 23, 2016. Obtain urine culture at this visit  Procedure: Unspecified Date - Cysto Uretero Lithotripsy - [phone], right          Document Letter(s):  Created for Patient: Clinical Summary    The patient and I talked about surgical treatment for their ureteral calculus. Etiologies of urinary calculi including dehydration, poor fluid intake, intake of well water, congenital renal disease, previous bowel surgery, idiopathic, and others were discussed with the patient.   Metabolic evaluation of ureteral stone disease was discussed with the patient. Alternative treatment options including increased water intake, increased lemonade intake, and dietary moderation with calcium and oxalate containing foods were discussed with the patient in detail.   The risks, benefits, and some of the possible complications of surgical treatments including cystoscopy, ureteroscopy, renoscopy, laser lithotripsy, extracorporeal shock wave lithotripsy, stent insertion and others were discussed with the patient. All questions were answered.  The patient gave fully informed consent to proceed with a ureteroscopy with or without laser lithotripsy with stone extraction for the treatment of their ureteral calculi.   The patient was given instructions to call for abdominal pain, pelvic pain, perirectal pain, nausea, vomiting, diarrhea, fever over 100 degrees F, chills, hematuria, or dysuria.        Notes:   -plan for cysto/right URS/laser litho/stent exchange  -patient has been on antibiotics for 5 days for UTI. Currently still  taking.

## 2016-11-28 NOTE — Transfer of Care (Signed)
Immediate Anesthesia Transfer of Care Note  Patient: Jose Brooks  Procedure(s) Performed: Procedure(s): CYSTOSCOPY/URETEROSCOPY/RETROGRADE PYELOGRAM/HOLMIUM LASER ABLATION/RESECTION OF RIGHT RENAL PELVIS TUMOR/STENT EXCHANGE (Right)  Patient Location: PACU  Anesthesia Type:General  Level of Consciousness: awake, alert  and oriented  Airway & Oxygen Therapy: Patient Spontanous Breathing and Patient connected to face mask oxygen  Post-op Assessment: Report given to RN and Post -op Vital signs reviewed and stable  Post vital signs: Reviewed and stable  Last Vitals:  Vitals:   11/28/16 1017  BP: (!) 157/82  Pulse: 68  Resp: 16  Temp: 36.6 C    Last Pain:  Vitals:   11/28/16 1050  TempSrc:   PainSc: 5       Patients Stated Pain Goal: 3 (85/02/77 4128)  Complications: No apparent anesthesia complications

## 2016-11-28 NOTE — Anesthesia Preprocedure Evaluation (Addendum)
Anesthesia Evaluation  Patient identified by MRN, date of birth, ID band Patient awake    Reviewed: Allergy & Precautions, NPO status , Patient's Chart, lab work & pertinent test results  Airway Mallampati: I       Dental  (+) Missing, Dental Advisory Given,    Pulmonary Current Smoker,    Pulmonary exam normal        Cardiovascular negative cardio ROS Normal cardiovascular exam     Neuro/Psych  Headaches, negative psych ROS   GI/Hepatic negative GI ROS, Neg liver ROS,   Endo/Other  negative endocrine ROS  Renal/GU negative Renal ROS  negative genitourinary   Musculoskeletal  (+) Arthritis , Osteoarthritis,    Abdominal   Peds negative pediatric ROS (+)  Hematology negative hematology ROS (+)   Anesthesia Other Findings   Reproductive/Obstetrics negative OB ROS                            Anesthesia Physical Anesthesia Plan  ASA: II  Anesthesia Plan: General   Post-op Pain Management:    Induction: Intravenous  PONV Risk Score and Plan: 1 and Ondansetron and Treatment may vary due to age  Airway Management Planned: LMA  Additional Equipment:   Intra-op Plan:   Post-operative Plan: Extubation in OR  Informed Consent: I have reviewed the patients History and Physical, chart, labs and discussed the procedure including the risks, benefits and alternatives for the proposed anesthesia with the patient or authorized representative who has indicated his/her understanding and acceptance.   Dental advisory given  Plan Discussed with: CRNA  Anesthesia Plan Comments:         Anesthesia Quick Evaluation

## 2016-11-28 NOTE — Discharge Instructions (Signed)
You had a belladonna suppository at 6:15 PM for the pain and burning with urination. It will make you sleepy.   Ureteral Stent Implantation, Care After Refer to this sheet in the next few weeks. These instructions provide you with information about caring for yourself after your procedure. Your health care provider may also give you more specific instructions. Your treatment has been planned according to current medical practices, but problems sometimes occur. Call your health care provider if you have any problems or questions after your procedure. What can I expect after the procedure? After the procedure, it is common to have: Nausea. Mild pain when you urinate. You may feel this pain in your lower back or lower abdomen. Pain should stop within a few minutes after you urinate. This may last for up to 1 week. A small amount of blood in your urine for several days.  Follow these instructions at home:  Medicines Take over-the-counter and prescription medicines only as told by your health care provider. If you were prescribed an antibiotic medicine, take it as told by your health care provider. Do not stop taking the antibiotic even if you start to feel better. Do not drive for 24 hours if you received a sedative. Do not drive or operate heavy machinery while taking prescription pain medicines. Activity Return to your normal activities as told by your health care provider. Ask your health care provider what activities are safe for you. Do not lift anything that is heavier than 10 lb (4.5 kg). Follow this limit for 1 week after your procedure, or for as long as told by your health care provider. General instructions Watch for any blood in your urine. Call your health care provider if the amount of blood in your urine increases. If you have a catheter: Follow instructions from your health care provider about taking care of your catheter and collection bag. Do not take baths, swim, or use a hot  tub until your health care provider approves. Drink enough fluid to keep your urine clear or pale yellow. Keep all follow-up visits as told by your health care provider. This is important. Contact a health care provider if: You have pain that gets worse or does not get better with medicine, especially pain when you urinate. You have difficulty urinating. You feel nauseous or you vomit repeatedly during a period of more than 2 days after the procedure. Get help right away if: Your urine is dark red or has blood clots in it. You are leaking urine (have incontinence). The end of the stent comes out of your urethra. You cannot urinate. You have sudden, sharp, or severe pain in your abdomen or lower back. You have a fever. This information is not intended to replace advice given to you by your health care provider. Make sure you discuss any questions you have with your health care provider. Document Released: 02/11/2013 Document Revised: 11/17/2015 Document Reviewed: 12/24/2014 Elsevier Interactive Patient Education  2018 [employer].  Cystoscopy, Care After Refer to this sheet in the next few weeks. These instructions provide you with information about caring for yourself after your procedure. Your health care provider may also give you more specific instructions. Your treatment has been planned according to current medical practices, but problems sometimes occur. Call your health care provider if you have any problems or questions after your procedure. What can I expect after the procedure? After the procedure, it is common to have:  Mild pain when you urinate. Pain should stop  within a few minutes after you urinate. This may last for up to 1 week.  A small amount of blood in your urine for several days.  Feeling like you need to urinate but producing only a small amount of urine.  Follow these instructions at home:  Medicines  Take over-the-counter and prescription medicines only as  told by your health care provider.  If you were prescribed an antibiotic medicine, take it as told by your health care provider. Do not stop taking the antibiotic even if you start to feel better. General instructions   Return to your normal activities as told by your health care provider. Ask your health care provider what activities are safe for you.  Do not drive for 24 hours if you received a sedative.  Watch for any blood in your urine. If the amount of blood in your urine increases, call your health care provider.  Follow instructions from your health care provider about eating or drinking restrictions.  If a tissue sample was removed for testing (biopsy) during your procedure, it is your responsibility to get your test results. Ask your health care provider or the department performing the test when your results will be ready.  Drink enough fluid to keep your urine clear or pale yellow.  Keep all follow-up visits as told by your health care provider. This is important. Contact a health care provider if:  You have pain that gets worse or does not get better with medicine, especially pain when you urinate.  You have difficulty urinating. Get help right away if:  You have more blood in your urine.  You have blood clots in your urine.  You have abdominal pain.  You have a fever or chills.  You are unable to urinate. This information is not intended to replace advice given to you by your health care provider. Make sure you discuss any questions you have with your health care provider. Document Released: 12/29/2004 Document Revised: 11/17/2015 Document Reviewed: 04/28/2015 Elsevier Interactive Patient Education  2017 [place] Anesthesia, Adult, Care After These instructions provide you with information about caring for yourself after your procedure. Your health care provider may also give you more specific instructions. Your treatment has been planned according  to current medical practices, but problems sometimes occur. Call your health care provider if you have any problems or questions after your procedure. What can I expect after the procedure? After the procedure, it is common to have:  Vomiting.  A sore throat.  Mental slowness.  It is common to feel:  Nauseous.  Cold or shivery.  Sleepy.  Tired.  Sore or achy, even in parts of your body where you did not have surgery.  Follow these instructions at home: For at least 24 hours after the procedure:  Do not: ? Participate in activities where you could fall or become injured. ? Drive. ? Use heavy machinery. ? Drink alcohol. ? Take sleeping pills or medicines that cause drowsiness. ? Make important decisions or sign legal documents. ? Take care of children on your own.  Rest. Eating and drinking  If you vomit, drink water, juice, or soup when you can drink without vomiting.  Drink enough fluid to keep your urine clear or pale yellow.  Make sure you have little or no nausea before eating solid foods.  Follow the diet recommended by your health care provider. General instructions  Have a responsible adult stay with you until you are awake and alert.  Return to your normal activities as told by your health care provider. Ask your health care provider what activities are safe for you.  Take over-the-counter and prescription medicines only as told by your health care provider.  If you smoke, do not smoke without supervision.  Keep all follow-up visits as told by your health care provider. This is important. Contact a health care provider if:  You continue to have nausea or vomiting at home, and medicines are not helpful.  You cannot drink fluids or start eating again.  You cannot urinate after 8-12 hours.  You develop a skin rash.  You have fever.  You have increasing redness at the site of your procedure. Get help right away if:  You have difficulty  breathing.  You have chest pain.  You have unexpected bleeding.  You feel that you are having a life-threatening or urgent problem. This information is not intended to replace advice given to you by your health care provider. Make sure you discuss any questions you have with your health care provider. Document Released: 09/17/2000 Document Revised: 11/14/2015 Document Reviewed: 05/26/2015 Elsevier Interactive Patient Education  [employer].

## 2016-11-28 NOTE — Op Note (Signed)
Date of procedure: 11/28/16  Preoperative diagnosis:  1. Right ureteral stone   Postoperative diagnosis:  1. Right ureteral stone-passed 2. 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 Cystoscopy 2. Right ureteroscopy 3. Right renal pelvis tumor biopsy/ablation/resection with laser 4. Right retrograde pyelogram with interpretation 5. Right ureteral stent placement 6 French by 26 cm  Surgeon: Baruch Gouty, MD  Anesthesia: General  Complications: None  Intraoperative findings: Upon entering the right renal pelvis the patient was noted to be the very large approximately 10 cm right renal pelvis tumor that was concerning for TCC. I was unable to locate the stone due to the massive size of the tumor. Over approximately 3 hours, this tumor was ablated eventually appeared to be almost on a stalk. Near the end of the ablation, the tumor broke free. It was then removed with stone basket and sent to pathology. Right renal pelvis biopsy was also performed prior to ablation. The stone was unable to be located in the procedure though it may washed out with large fragments of tumor through the access sheath.  EBL: None  Specimens: Right renal pelvis tumor biopsy, right renal pelvis tumor  Drains: Right 6 French by 26 cm double-J ureteral stent  Disposition: Stable to the postanesthesia care unit  Indication for procedure: The patient is a 56 y.o. male with history of sepsis from right ureteral stone status post stent placement in the [place] who presents for definitive stone management. He did have positive urine culture has been on culture appropriate antibiotics for over 5 days..  After reviewing the management options for treatment, the patient elected to proceed with the above surgical procedure(s). We have discussed the potential benefits and risks of the procedure, side effects of the proposed treatment, the likelihood of the patient achieving the goals of the procedure, and any  potential problems that might occur during the procedure or recuperation. Informed consent has been obtained.  Description of procedure: The patient was met in the preoperative area. All risks, benefits, and indications of the procedure were described in great detail. The patient consented to the procedure. Preoperative antibiotics were given. The patient was taken to the operative theater. General anesthesia was induced per the anesthesia service. The patient was then placed in the dorsal lithotomy position and prepped and draped in the usual sterile fashion. A preoperative timeout was called.   21 French 30 cystoscope was inserted into the patient's bladder atraumatically per urethra. The right ureteral stent was grasped with graspers and brought to the urethral meatus. A sensor wire was exchanged up to the level of the right renal pelvis under fluoroscopy. A dual-lumen catheter a second sensor wire was placed up to the level of the renal pelvis. A ureteral access sheath was then placed under fluoroscopy atraumatically. The UPJ was inspected which showed no evidence of stone disease. Recommend the right renal pelvis was noted very large tumor within the pelvis itself. Approximately 10 cm in size. It appeared to be growing into the pelvis. Using the stone basket, 4 biopsies of this tumor were obtained. Using the holmium laser, it was ablated from approximately 3 hours. Towards end of the ablation, it appeared just to originate from a stalk and fragments began to break off. All fragments of the bladder tumor then migrated to the right upper pole regressed with stone basket and sent to pathology. There is approximately 10 cm of tumor that was sent. At this point it did seem that the tumor did mainly  originate from the right posterior pelvis. Most of the mucosa did not have evidence of tumor. Instrument list on the most ablation was ablating this exophytic tumor from the right posterior medial pelvis. No other  tumors were found within the poles. The stone was unable to locate this time. All fragments of tumor were removed. A right retrograde powder was then obtained showing no filling defects and was helpful identified eclectic system. The access sheath was then removed under direct visualization showing no significant trauma. Over the remaining a sensor wire cystoscope was assembled a 6 [place] by 26 cm double-J ureteral stent was placed. Sensor was removed. A curl was in the patient's right pelvis under fluoroscopy in the urinary bladder under direct visualization. The patient's bladder was drained as local anesthesia and transferred in stable condition to the postanesthesia care unit.  Plan: The patient will follow-up in one week for stent removal. Pending his pathology results, he will need surveillance ureteroscopy versus nephroureterectomy. The stone is presumed to have passed in large chunks stone fragments came out the ureteral access sheath. It was not seen in the ureter or in the renal pelvis.  Baruch Gouty, M.D.

## 2016-11-28 NOTE — Anesthesia Procedure Notes (Signed)
Procedure Name: LMA Insertion Date/Time: 11/28/2016 12:47 PM Performed by: Carleene Cooper A Pre-anesthesia Checklist: Patient identified, Emergency Drugs available, Suction available, Patient being monitored and Timeout performed Patient Re-evaluated:Patient Re-evaluated prior to inductionOxygen Delivery Method: Circle system utilized Preoxygenation: Pre-oxygenation with 100% oxygen Intubation Type: IV induction Ventilation: Mask ventilation without difficulty LMA: LMA flexible inserted Number of attempts: 1 Placement Confirmation: positive ETCO2 and breath sounds checked- equal and bilateral Tube secured with: Tape Dental Injury: Teeth and Oropharynx as per pre-operative assessment

## 2016-11-28 NOTE — Progress Notes (Signed)
Patient is having a lot of pain post op which radiates from right flank down to genitals when he urinates. Called on call urologist and received call back from [facility]. Orders received.

## 2016-11-29 ENCOUNTER — Encounter (HOSPITAL_COMMUNITY): Payer: Self-pay | Admitting: Urology

## 2016-12-03 NOTE — Anesthesia Postprocedure Evaluation (Signed)
Anesthesia Post Note  Patient: Jose Brooks  Procedure(s) Performed: Procedure(s) (LRB): CYSTOSCOPY/URETEROSCOPY/RETROGRADE PYELOGRAM/HOLMIUM LASER ABLATION/RESECTION OF RIGHT RENAL PELVIS TUMOR/STENT EXCHANGE (Right)     Patient location during evaluation: PACU Anesthesia Type: General Level of consciousness: awake and alert and patient cooperative Pain management: pain level controlled Vital Signs Assessment: post-procedure vital signs reviewed and stable Respiratory status: spontaneous breathing and respiratory function stable Cardiovascular status: stable Anesthetic complications: no    Last Vitals:  Vitals:   11/28/16 1712 11/28/16 1831  BP: (!) 148/84 (!) 155/80  Pulse: 61 71  Resp: 14   Temp: 36.4 C 36.9 C    Last Pain:  Vitals:   11/28/16 1831  TempSrc: Oral  PainSc: [place]

## 2016-12-25 ENCOUNTER — Telehealth: Payer: Self-pay | Admitting: Emergency Medicine

## 2016-12-25 NOTE — Telephone Encounter (Signed)
LOST TO FOLLOWUP 

## 2018-07-27 IMAGING — CT CT RENAL STONE PROTOCOL
2 of 4 series · 12 of 46 positions shown, 14 images · non-contrast
Comparison: None.

CLINICAL DATA: Right-sided flank pain and hematuria. Recent stent
placement.

EXAM:
CT ABDOMEN AND PELVIS WITHOUT CONTRAST
TECHNIQUE: Multidetector CT imaging of the abdomen and pelvis was performed
following the standard protocol without IV contrast.

[Series 201: stone study, idose (2) · axial · 0.68mm/px · z∈[-503,-153]mm · 9 of 84 slices shown, 11 images]
[im 7/84  soft-tissue]
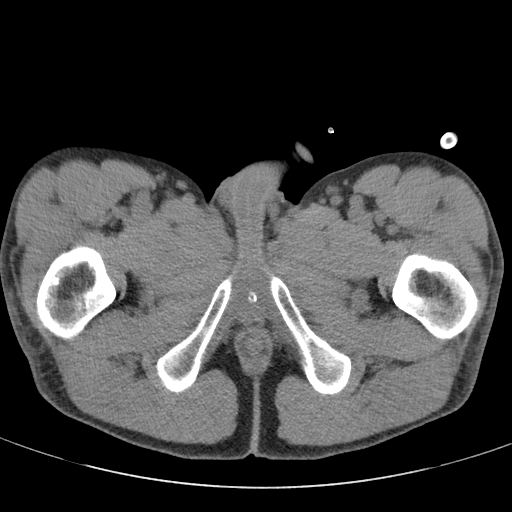
[im 7/84  bone]
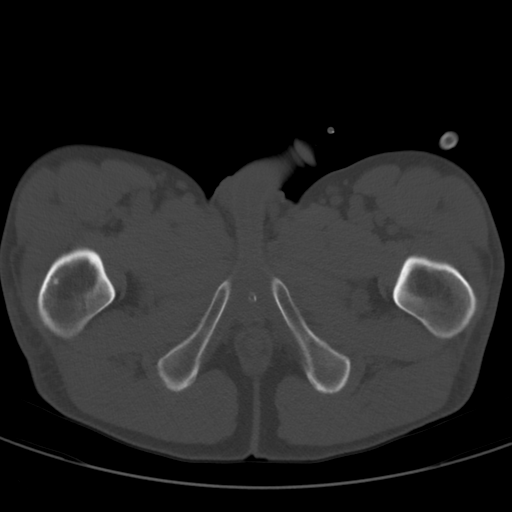
[im 17/84  soft-tissue]
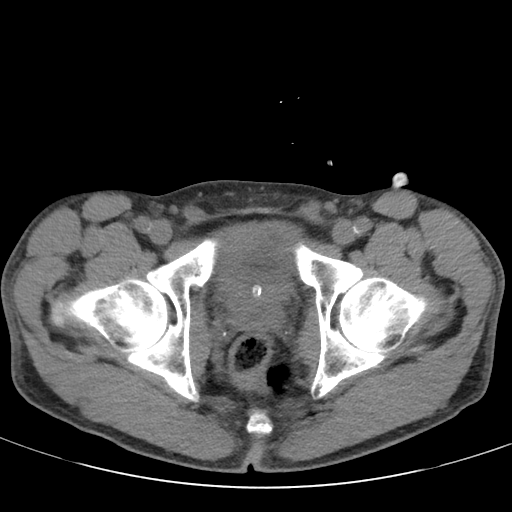
[im 24/84  soft-tissue]
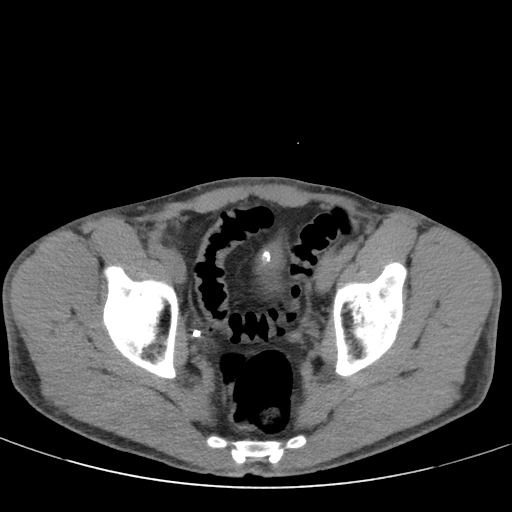
[im 34/84  soft-tissue]
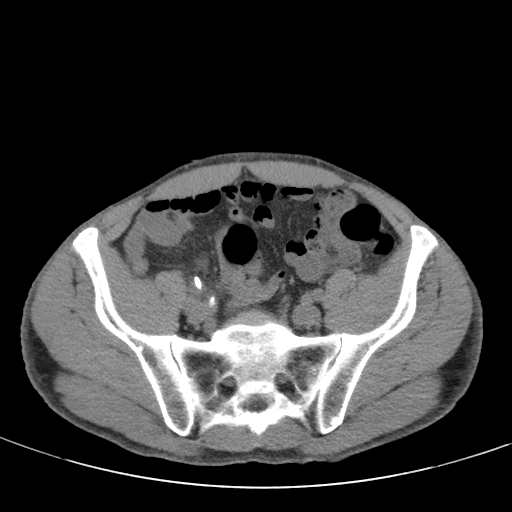
[im 44/84  soft-tissue]
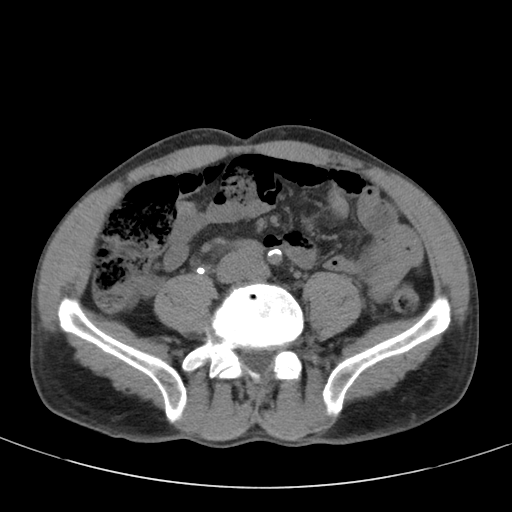
[im 50/84  soft-tissue]
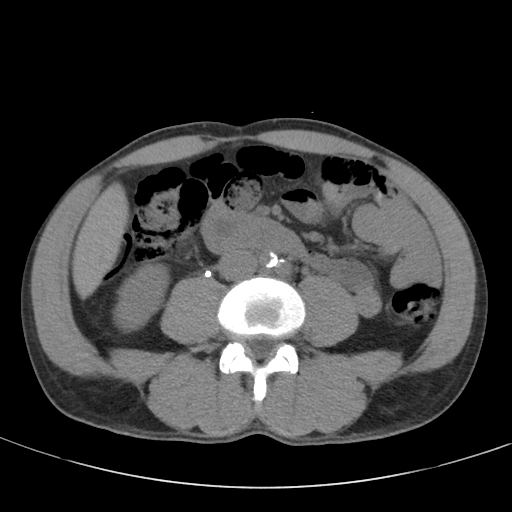
[im 60/84  soft-tissue]
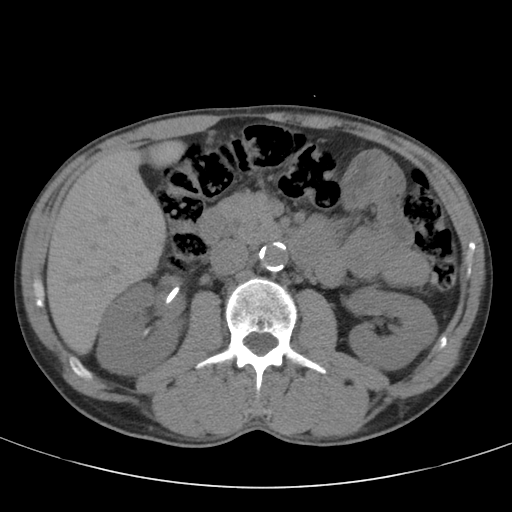
[im 70/84  soft-tissue]
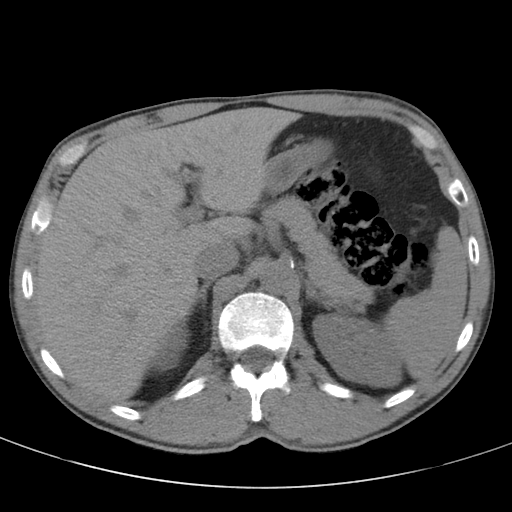
[im 77/84  soft-tissue]
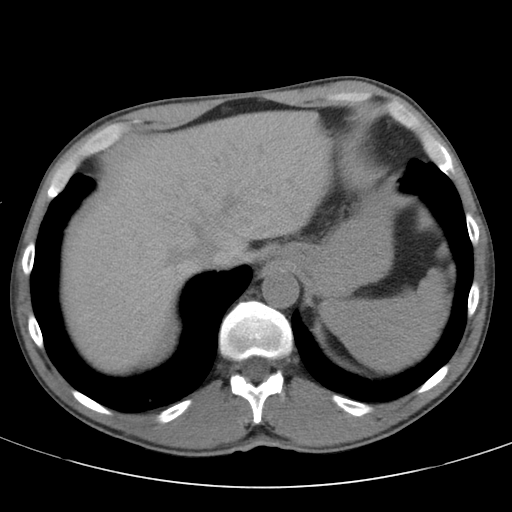
[im 77/84  bone]
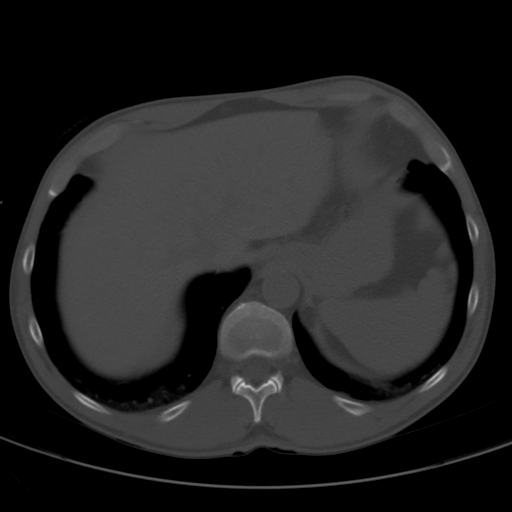

[Series 203: coronals, idose (2) · coronal · 0.45mm/px · 3 of 114 slices shown]
[im 38/114  soft-tissue]
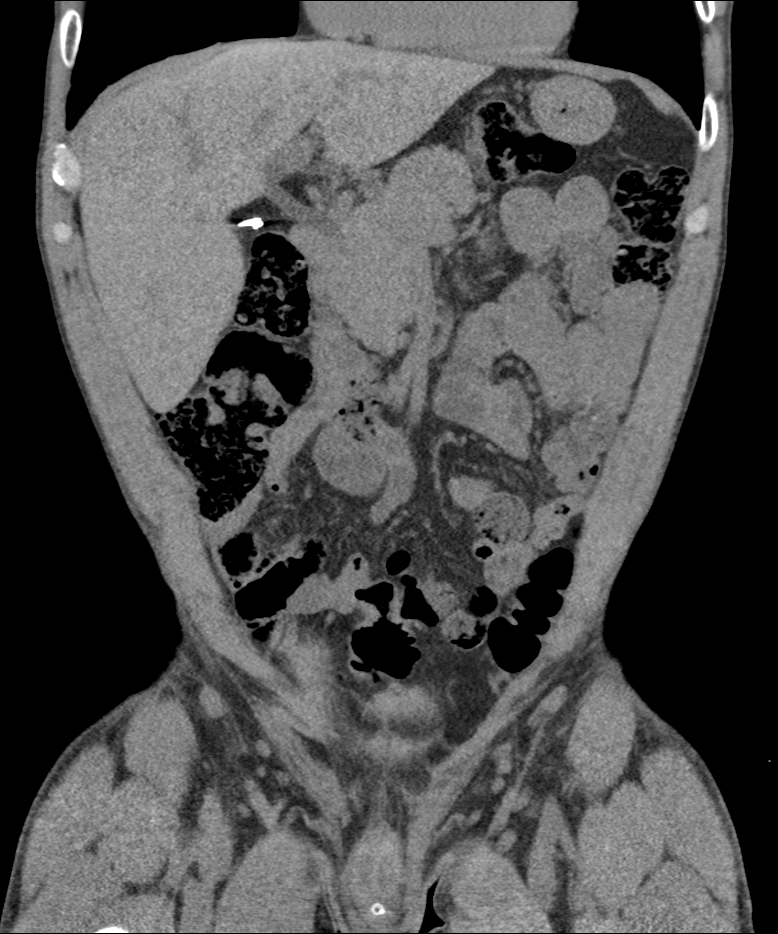
[im 51/114  soft-tissue]
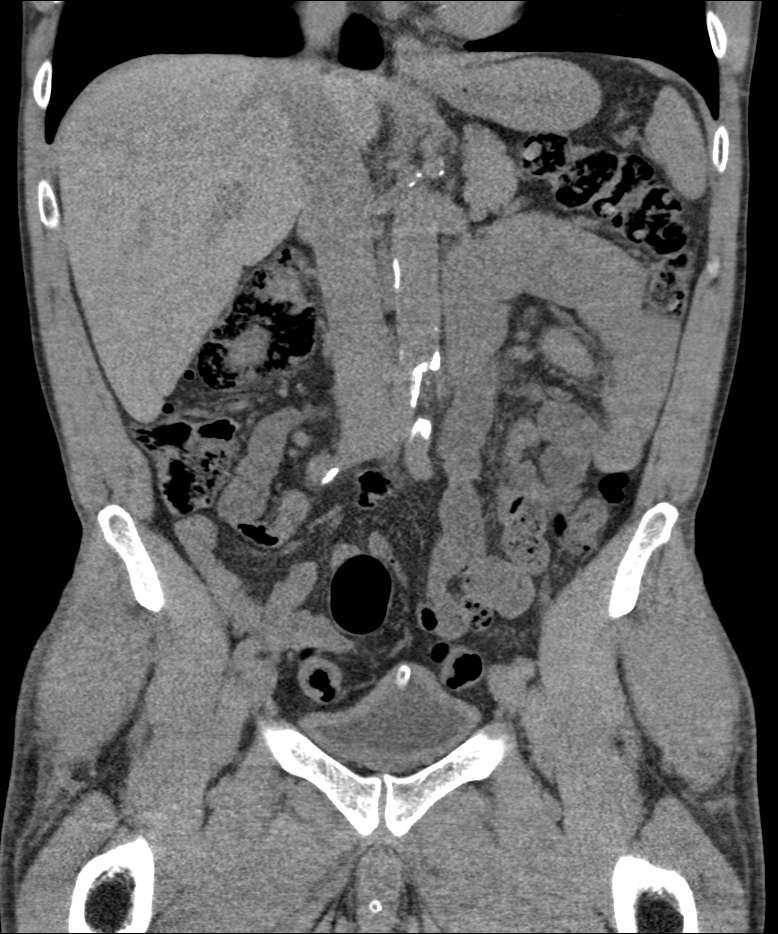
[im 63/114  soft-tissue]
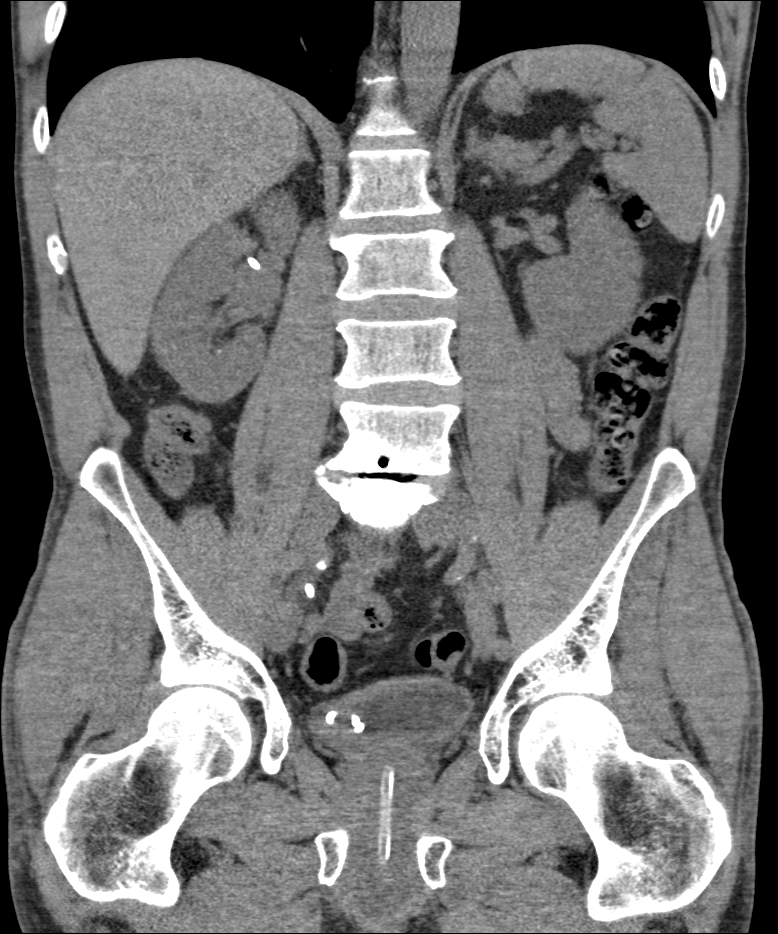

[12 of 46 positions shown; findings below may reference images not displayed]

FINDINGS: Lower chest: Normal

Hepatobiliary: Liver appears normal.  Previous cholecystectomy.

Pancreas: Normal

Spleen: Normal

Adrenals/Urinary Tract: Adrenal glands are normal. 2 mm
nonobstructing stone in the upper pole the left kidney. Double-J
ureteral stent in place on the right, grossly well positioned.
Nonobstructing 1 mm stone in the lower pole the right kidney. 4 mm
stone in the extra renal pelvis adjacent to the proximal stent. 2 mm
stone dependent in the bladder. Catheter in place within the bladder
as well.

Stomach/Bowel: Normal

Vascular/Lymphatic: Aortic atherosclerosis. No aneurysm. IVC is
normal. No retroperitoneal mass or lymphadenopathy.

Reproductive: Normal

Other: No free fluid or air.

Musculoskeletal: Chronic lower lumbar degenerative changes.
IMPRESSION: Double-J ureteral stent on the right grossly well positioned. 4 mm
stone adjacent to the stent in the extra renal pelvis. 2 mm stone in
the bladder.

Nonobstructing 1 mm stone lower pole right kidney and 2 mm stone
upper pole left kidney.

Aortic atherosclerosis.

Previous cholecystectomy.

Lower lumbar degenerative changes.

## 2019-06-26 ENCOUNTER — Ambulatory Visit
Admission: EM | Admit: 2019-06-26 | Discharge: 2019-06-26 | Disposition: A | Discharge status: Home / Self Care | Payer: Self-pay

## 2019-06-26 ENCOUNTER — Other Ambulatory Visit: Payer: Self-pay

## 2019-06-26 NOTE — ED Notes (Signed)
After reviewing med wanting to be refilled with patient and APP, patient made aware we would be unable to fill it here.  Patient verbalized understanding, and did not want to complete his visit.  WIll discharge.

## 2019-07-19 ENCOUNTER — Encounter: Payer: Self-pay | Admitting: Emergency Medicine

## 2019-07-19 ENCOUNTER — Ambulatory Visit
Admission: EM | Admit: 2019-07-19 | Discharge: 2019-07-19 | Disposition: A | Discharge status: Home / Self Care | Payer: Self-pay | Attending: Physician Assistant | Admitting: Physician Assistant

## 2019-07-19 ENCOUNTER — Other Ambulatory Visit: Payer: Self-pay

## 2019-07-19 DIAGNOSIS — M25561 Pain in right knee: Secondary | ICD-10-CM

## 2019-07-19 DIAGNOSIS — M25531 Pain in right wrist: Secondary | ICD-10-CM

## 2019-07-19 DIAGNOSIS — M25532 Pain in left wrist: Secondary | ICD-10-CM

## 2019-07-19 DIAGNOSIS — M25562 Pain in left knee: Secondary | ICD-10-CM

## 2019-07-19 MED ORDER — PREDNISONE 50 MG PO TABS: 50.0000 mg | ORAL_TABLET | Freq: Every day | ORAL | 0 refills | Status: DC

## 2019-07-19 NOTE — ED Triage Notes (Signed)
Pt here for bilateral wrist and knee pain x 4 weeks; denies injury

## 2019-07-19 NOTE — ED Provider Notes (Signed)
[facility]    CSN: [id number] Arrival date & time: 07/19/19  1241      History   Chief Complaint Chief Complaint  Patient presents with  . Wrist Pain  . Knee Pain    HPI Jose Brooks is a 59 y.o. male.   59 year old male comes in for 4 week history of bilateral wrist and knee pain. Denies injury/trauma. States pain is worse first thing in the morning, then improves throughout the day. Has had some swelling to the feet that does not seem to improve the symptoms. States wrists and knees are swollen first thing in the morning as well this improves throughout the day. Work does require repetitives motions and climbing ladders. Has not taken anything for the symptoms.      Past Medical History:  Diagnosis Date  . Arthritis   . Headache    occasionally lately   . History of kidney stones   . Mosquito bite    says a virus was introduced with bite in [facility] [place]     There are no problems to display for this patient.   Past Surgical History:  Procedure Laterality Date  . CHOLECYSTECTOMY    . CYSTOSCOPY/URETEROSCOPY/HOLMIUM LASER/STENT PLACEMENT Right 11/28/2016   Procedure: CYSTOSCOPY/URETEROSCOPY/RETROGRADE PYELOGRAM/HOLMIUM LASER ABLATION/RESECTION OF RIGHT RENAL PELVIS TUMOR/STENT EXCHANGE;  Surgeon: Nickie Retort, MD;  Location: [facility];  Service: Urology;  Laterality: Right;  . THROAT SURGERY     can not remember what was done; says it was due to a virus       Home Medications    Prior to Admission medications   Medication Sig Start Date End Date Taking? Authorizing Provider  predniSONE (DELTASONE) 50 MG tablet Take 1 tablet (50 mg total) by mouth daily with breakfast. 07/19/19   Ok Edwards, PA-C   Family History Family History  Problem Relation Age of Onset  . Nephrolithiasis Brother    Social History Social History   Tobacco Use  . Smoking status: Current Every Day Smoker    Packs/day: 1.50    Years: 40.00    Pack  years: 60.00    Types: Cigarettes  . Smokeless tobacco: Never Used  Substance Use Topics  . Alcohol use: Yes    Alcohol/week: 7.0 standard drinks    Types: 7 Glasses of wine per week    Comment: daily  . Drug use: Yes    Types: Marijuana, Cocaine    Comment: last six months ago      Allergies   Hydrocodone   Review of Systems Review of Systems  Reason unable to perform ROS: See HPI as above.     Physical Exam Triage Vital Signs ED Triage Vitals [07/19/19 1300]  Enc Vitals Group     BP (!) 154/80     Pulse Rate 69     Resp 18     Temp 98 F (36.7 C)     Temp Source Oral     SpO2 95 %     Weight      Height      Head Circumference      Peak Flow      Pain Score 8     Pain Loc      Pain Edu?       Excl. in [place]?    No data found.  Updated Vital Signs BP (!) 154/80 (BP Location: Right Arm)   Pulse 69   Temp 98 F (  36.7 C) (Oral)   Resp 18   SpO2 95%   Physical Exam Constitutional:      General: He is not in acute distress.    Appearance: Normal appearance. He is well-developed. He is not toxic-appearing or diaphoretic.  HENT:     Head: Normocephalic and atraumatic.  Eyes:     Conjunctiva/sclera: Conjunctivae normal.     Pupils: Pupils are equal, round, and reactive to light.  Pulmonary:     Effort: Pulmonary effort is normal. No respiratory distress.     Comments: Speaking in full sentences without difficulty Musculoskeletal:     Cervical back: Normal range of motion and neck supple.     Comments: No erythema, warmth, contusion seen to the upper and lower extremity.  Wrist: No tenderness to palpation bilaterally.  Decreased dorsiflexion bilaterally of wrist.  Otherwise strength normal and equal bilaterally.  Normal grip strength.  Sensation intact and equal bilaterally.  Radial pulse 2+.  Knee: No tenderness to palpation bilaterally.  Full range of motion.  Strength normal and equal bilaterally.  Sensation intact ankle bilaterally.  Ankle/feet:  Swelling to bilateral feet extending to ankle, 1+ pitting edema.  No erythema or warmth.  No tenderness to palpation.  Full range of motion of ankle and toes.  Strength normal ankle bilaterally.  Sensation intact ankle bilaterally.  Pedal pulse 2+.  Skin:    General: Skin is warm and dry.  Neurological:     Mental Status: He is alert and oriented to person, place, and time.      UC Treatments / Results  Labs (all labs ordered are listed, but only abnormal results are displayed) Labs Reviewed - No data to display  EKG   Radiology No results found.  Procedures Procedures (including critical care time)  Medications Ordered in UC Medications - No data to display  Initial Impression / Assessment and Plan / UC Course  I have reviewed the triage vital signs and the nursing notes.  Pertinent labs & imaging results that were available during my care of the patient were reviewed by me and considered in my medical decision making (see chart for details).    ? RA vs overuse/tendinitis.  We will start prednisone as directed.  Will have patient elevate feet, Ace wrap to help with swelling.  Otherwise patient to follow-up with PCP for further evaluation and management needed.  Resources provided.  Return precautions given.  Patient expresses understanding and agrees to plan.  Final Clinical Impressions(s) / UC Diagnoses   Final diagnoses:  Bilateral wrist pain  Acute pain of both knees   ED Prescriptions    Medication Sig Dispense Auth. Provider   predniSONE (DELTASONE) 50 MG tablet Take 1 tablet (50 mg total) by mouth daily with breakfast. 5 tablet Ok Edwards, PA-C     I have reviewed the PDMP during this encounter.   Ok Edwards, PA-C 07/19/19 1432

## 2019-07-19 NOTE — Discharge Instructions (Signed)
Start prednisone as directed. Ice compress to the joints. Elevation of the feet above the heart to help circulation. Ace wrap to help with feet swelling. Follow up with PCP for further evaluation and possible referrals if needed.

## 2019-08-03 NOTE — Progress Notes (Signed)
Subjective:    Patient ID: Jose Brooks, male    DOB: 11/14/1960, 59 y.o.   MRN: [id number]  59 y.o.M who just moved to the [place] area from [place] in November 2020.  The patient has a prior history of renal cancer with a left kidney removed in January 2020.  The patient is living with his ex-girlfriend and works on a utility pole cabling.  The patient has a history of hypertension but has been off all medications since he moved from [place].  He smokes a pack a day of cigarettes.  He does have longstanding hypertension.  He complains now of increased wrist and hand pain and also some ankle pain.  His finger stay numb and eyeglass will slip through his hands.  He has difficulty with his pain in his wrist as well.  He went to urgent care recently was prescribed prednisone with some relief of the pain.  Note the patient is self-pay and needs financial assistance. The patient denies any chest pain or shortness of breath.  The patient has no cough.  There is no edema.  Note the patient is on Subutex and receives this from a substance use clinic where he is receiving medication assisted therapy for chronic opioid use.   He is desiring to transfer his care to a clinic in the [place] area as he currently goes to [facility] with this care  Past Medical History:  Diagnosis Date  . Arthritis   . Headache    occasionally lately   . History of kidney stones   . Mosquito bite    says a virus was introduced with bite in [facility] ; [place]      Family History  Problem Relation Age of Onset  . Nephrolithiasis Brother      Social History   Socioeconomic History  . Marital status: Single    Spouse name: Not on file  . Number of children: Not on file  . Years of education: Not on file  . Highest education level: Not on file  Occupational History  . Not on file  Tobacco Use  . Smoking status: Current Every Day Smoker    Packs/day: 1.00    Years: 40.00   Pack years: 40.00    Types: Cigarettes  . Smokeless tobacco: Never Used  Substance and Sexual Activity  . Alcohol use: Yes    Alcohol/week: 7.0 standard drinks    Types: 7 Glasses of wine per week    Comment: daily  . Drug use: Yes    Types: Marijuana, Cocaine    Comment: last six months ago   . Sexual activity: Yes    Birth control/protection: None  Other Topics Concern  . Not on file  Social History Narrative  . Not on file   Social Determinants of Health   Financial Resource Strain:   . Difficulty of Paying Living Expenses: Not on file  Food Insecurity:   . Worried About Charity fundraiser in the Last Year: Not on file  . Ran Out of Food in the Last Year: Not on file  Transportation Needs:   . Lack of Transportation (Medical): Not on file  . Lack of Transportation (Non-Medical): Not on file  Physical Activity:   . Days of Exercise per Week: Not on file  . Minutes of Exercise per Session: Not on file  Stress:   . Feeling of Stress : Not on file  Social Connections:   . Frequency  of Communication with Friends and Family: Not on file  . Frequency of Social Gatherings with Friends and Family: Not on file  . Attends Religious Services: Not on file  . Active Member of Clubs or Organizations: Not on file  . Attends Archivist Meetings: Not on file  . Marital Status: Not on file  Intimate Partner Violence:   . Fear of Current or Ex-Partner: Not on file  . Emotionally Abused: Not on file  . Physically Abused: Not on file  . Sexually Abused: Not on file     Allergies  Allergen Reactions  . Hydrocodone Itching     Outpatient Medications Prior to Visit  Medication Sig Dispense Refill  . buprenorphine (SUBUTEX) 8 MG SUBL SL tablet Place 8 mg under the tongue daily.    . predniSONE (DELTASONE) 50 MG tablet Take 1 tablet (50 mg total) by mouth daily with breakfast. 5 tablet 0   No facility-administered medications prior to visit.       Review of  Systems Constitutional:   No  weight loss, night sweats,  Fevers, chills, fatigue, lassitude. HEENT:   No headaches,  Difficulty swallowing,  Tooth/dental problems,  Sore throat,                No sneezing, itching, ear ache, nasal congestion, post nasal drip,   CV:  No chest pain,  Orthopnea, PND, swelling in lower extremities, anasarca, dizziness, palpitations  GI  No heartburn, indigestion, abdominal pain, nausea, vomiting, diarrhea, change in bowel habits, loss of appetite  Resp: No shortness of breath with exertion or at rest.  No excess mucus, no productive cough,  No non-productive cough,  No coughing up of blood.  No change in color of mucus.  No wheezing.  No chest wall deformity  Skin: no rash or lesions.  GU: no dysuria, change in color of urine, no urgency or frequency.  No flank pain.  MS:   joint pain or swelling.   decreased range of motion.  No back pain.  Psych:  No change in mood or affect.  depression or anxiety.  No memory loss.     Objective:   Physical Exam Vitals:   08/04/19 0928  BP: (!) 145/86  Pulse: 68  Resp: 18  Temp: (!) 97.1 F (36.2 C)  TempSrc: Oral  SpO2: 98%  Weight: 166 lb (75.3 kg)  Height: 5\' 9"  (1.753 m)    Gen: Pleasant, well-nourished, in no distress,  normal affect  ENT: No lesions,  mouth clear,  oropharynx clear, no postnasal drip  Neck: No JVD, no TMG, no carotid bruits  Lungs: No use of accessory muscles, no dullness to percussion, clear without rales or rhonchi  Cardiovascular: RRR, heart sounds normal, no murmur or gallops, no peripheral edema  Abdomen: soft and NT, no HSM,  BS normal  Musculoskeletal: tender both wrists plantar aspect.  Decreased rom in left wrist.   Decreased sensation both palmar areas of hands.  Full rom of ankles, no edema, no deformity of ankles  Neuro: alert, non focal  Skin: Warm, no lesions or rashes  No results found.       Assessment & Plan:  I personally reviewed all images and lab  data in the [facility] system as well as any outside material available during this office visit and agree with the  radiology impressions.   Essential hypertension Hypertension now with blood pressure 145/86  Plan will be to begin losartan HCT 100/25 1 daily  We will follow-up with metabolic panel and blood counts    Bilateral carpal tunnel syndrome Probable progressive bilateral carpal tunnel syndrome  Will need to make referrals to orthopedics however before doing this the patient will need obtain financial assistance will be applying for [employer] financial discounts and the orange card  We will give pulse dose of prednisone in the interim  Polyarthritis The patient symptom complex is not consistent with rheumatoid arthritis and there are no physical exam findings compatible with rheumatoid arthritis nevertheless I will check rheumatoid factor sed rate  History of renal cell cancer History of renal cell cancer with nephrectomy will follow up with urinalysis and will yet need to see urology once he obtains financial assistance   Mikiel was seen today for wrist pain.  Diagnoses and all orders for this visit:  Polyarthritis -     Rheumatoid factor -     Sedimentation rate -     ANA; Future  Bilateral carpal tunnel syndrome  Essential hypertension -     Comprehensive metabolic panel -     CBC with Differential/Platelet  S/p nephrectomy  History of renal cell cancer -     Urinalysis, Complete  Need for hepatitis C screening test -     Hepatitis c antibody (reflex)  Encounter for screening for HIV -     HIV Antibody (routine testing w rflx)  Other orders -     Discontinue: losartan-hydrochlorothiazide (HYZAAR) 100-25 MG tablet; Take 1 tablet by mouth daily. -     Discontinue: predniSONE (DELTASONE) 10 MG tablet; Take 4 tablets daily for 5 days then stop -     losartan-hydrochlorothiazide (HYZAAR) 100-25 MG tablet; Take 1 tablet by mouth daily. -     predniSONE  (DELTASONE) 10 MG tablet; Take 4 tablets daily for 5 days then stop    We will screen for hepatitis C and HIV

## 2019-08-04 ENCOUNTER — Encounter: Payer: Self-pay | Admitting: Critical Care Medicine

## 2019-08-04 ENCOUNTER — Other Ambulatory Visit: Payer: Self-pay

## 2019-08-04 ENCOUNTER — Ambulatory Visit: Payer: Self-pay | Attending: Critical Care Medicine | Admitting: Critical Care Medicine

## 2019-08-04 VITALS — BP 145/86 | HR 68 | Temp 97.1°F | Resp 18 | Ht 69.0 in | Wt 166.0 lb

## 2019-08-04 DIAGNOSIS — Z85528 Personal history of other malignant neoplasm of kidney: Secondary | ICD-10-CM

## 2019-08-04 DIAGNOSIS — Z1159 Encounter for screening for other viral diseases: Secondary | ICD-10-CM

## 2019-08-04 DIAGNOSIS — G5603 Carpal tunnel syndrome, bilateral upper limbs: Secondary | ICD-10-CM

## 2019-08-04 DIAGNOSIS — Z114 Encounter for screening for human immunodeficiency virus [HIV]: Secondary | ICD-10-CM

## 2019-08-04 DIAGNOSIS — Z905 Acquired absence of kidney: Secondary | ICD-10-CM

## 2019-08-04 DIAGNOSIS — M130 Polyarthritis, unspecified: Secondary | ICD-10-CM

## 2019-08-04 DIAGNOSIS — I10 Essential (primary) hypertension: Secondary | ICD-10-CM

## 2019-08-04 MED ORDER — LOSARTAN POTASSIUM-HCTZ 100-25 MG PO TABS: 1.0000 | ORAL_TABLET | Freq: Every day | ORAL | 3 refills | Status: DC

## 2019-08-04 MED ORDER — PREDNISONE 10 MG PO TABS: ORAL_TABLET | ORAL | 0 refills | Status: DC

## 2019-08-04 MED ORDER — PREDNISONE 10 MG PO TABS: ORAL_TABLET | ORAL | 0 refills | Status: AC

## 2019-08-04 MED ORDER — LOSARTAN POTASSIUM-HCTZ 100-25 MG PO TABS: 1.0000 | ORAL_TABLET | Freq: Every day | ORAL | 3 refills | Status: AC

## 2019-08-04 MED FILL — predniSONE 10 MG TABS: 10 | 5 days supply | Qty: 20 | Fill #0

## 2019-08-04 MED FILL — LOSARTAN-HCTZ 100-25 MG TAB: 100-25 | 30 days supply | Qty: 30 | Fill #0

## 2019-08-04 NOTE — Assessment & Plan Note (Signed)
The patient symptom complex is not consistent with rheumatoid arthritis and there are no physical exam findings compatible with rheumatoid arthritis nevertheless I will check rheumatoid factor sed rate

## 2019-08-04 NOTE — Patient Instructions (Addendum)
Begin prednisone 40 mg daily for 5 days  Begin losartan HCT 1 tablet daily for blood pressure  You will obtain financial paperwork to fill out and make an appointment with Clifton James our financial assistant to get financial assistance  Labs today include metabolic panel complete blood count rheumatoid panel and HIV hepatitis C and urinalysis  Return in follow-up with Dr. Joya Gaskins in 6 weeks  We will ultimately have you referred to orthopedics for your hand condition  Your medications were sent to our pharmacy  We also need to get you referred back to urology to follow-up for your history of kidney cancer and we will also need our licensed clinical social worker Christa See to connect with you to get you referred to [facility] for Suboxone treatment

## 2019-08-04 NOTE — Assessment & Plan Note (Signed)
History of renal cell cancer with nephrectomy will follow up with urinalysis and will yet need to see urology once he obtains financial assistance

## 2019-08-04 NOTE — Assessment & Plan Note (Addendum)
Probable progressive bilateral carpal tunnel syndrome  Will need to make referrals to orthopedics however before doing this the patient will need obtain financial assistance will be applying for [place] financial discounts and the orange card  We will give pulse dose of prednisone in the interim

## 2019-08-04 NOTE — Assessment & Plan Note (Signed)
Hypertension now with blood pressure 145/86  Plan will be to begin losartan HCT 100/25 1 daily  We will follow-up with metabolic panel and blood counts

## 2019-08-05 ENCOUNTER — Other Ambulatory Visit: Payer: Self-pay | Admitting: Critical Care Medicine

## 2019-08-05 ENCOUNTER — Ambulatory Visit: Payer: Self-pay | Attending: Family Medicine | Admitting: Licensed Clinical Social Worker

## 2019-08-05 DIAGNOSIS — B1920 Unspecified viral hepatitis C without hepatic coma: Secondary | ICD-10-CM

## 2019-08-05 DIAGNOSIS — F1121 Opioid dependence, in remission: Secondary | ICD-10-CM

## 2019-08-05 LAB — CBC WITH DIFFERENTIAL/PLATELET
Basophils Absolute: 0 10*3/uL (ref 0.0–0.2)
Basos: 0 %
EOS (ABSOLUTE): 0.3 10*3/uL (ref 0.0–0.4)
Eos: 5 %
Hematocrit: 43.6 % (ref 37.5–51.0)
Hemoglobin: 14.8 g/dL (ref 13.0–17.7)
Immature Grans (Abs): 0 10*3/uL (ref 0.0–0.1)
Immature Granulocytes: 0 %
Lymphocytes Absolute: 2.1 10*3/uL (ref 0.7–3.1)
Lymphs: 38 %
MCH: 29.7 pg (ref 26.6–33.0)
MCHC: 33.9 g/dL (ref 31.5–35.7)
MCV: 87 fL (ref 79–97)
Monocytes Absolute: 0.8 10*3/uL (ref 0.1–0.9)
Monocytes: 13 %
Neutrophils Absolute: 2.5 10*3/uL (ref 1.4–7.0)
Neutrophils: 44 %
Platelets: 211 10*3/uL (ref 150–450)
RBC: 4.99 x10E6/uL (ref 4.14–5.80)
RDW: 12.9 % (ref 11.6–15.4)
WBC: 5.7 10*3/uL (ref 3.4–10.8)

## 2019-08-05 LAB — COMPREHENSIVE METABOLIC PANEL
ALT: 98 IU/L — ABNORMAL HIGH (ref 0–44)
AST: 76 IU/L — ABNORMAL HIGH (ref 0–40)
Albumin/Globulin Ratio: 1.6 (ref 1.2–2.2)
Albumin: 4.1 g/dL (ref 3.8–4.9)
Alkaline Phosphatase: 107 IU/L (ref 39–117)
BUN/Creatinine Ratio: 12 (ref 9–20)
BUN: 15 mg/dL (ref 6–24)
Bilirubin Total: 0.4 mg/dL (ref 0.0–1.2)
CO2: 21 mmol/L (ref 20–29)
Calcium: 9.4 mg/dL (ref 8.7–10.2)
Chloride: 103 mmol/L (ref 96–106)
Creatinine, Ser: 1.26 mg/dL (ref 0.76–1.27)
GFR calc Af Amer: 73 mL/min/{1.73_m2} (ref >59)
GFR calc non Af Amer: 63 mL/min/{1.73_m2} (ref >59)
Globulin, Total: 2.6 g/dL (ref 1.5–4.5)
Glucose: 98 mg/dL (ref 65–99)
Potassium: 4.3 mmol/L (ref 3.5–5.2)
Sodium: 137 mmol/L (ref 134–144)
Total Protein: 6.7 g/dL (ref 6.0–8.5)

## 2019-08-05 LAB — COMMENT2 - HEP PANEL

## 2019-08-05 LAB — URINALYSIS, COMPLETE
Bilirubin, UA: NEGATIVE
Glucose, UA: NEGATIVE
Ketones, UA: NEGATIVE
Leukocytes,UA: NEGATIVE
Nitrite, UA: NEGATIVE
Protein,UA: NEGATIVE
RBC, UA: NEGATIVE
Specific Gravity, UA: 1.013 (ref 1.005–1.030)
Urobilinogen, Ur: 1 mg/dL (ref 0.2–1.0)
pH, UA: 6 (ref 5.0–7.5)

## 2019-08-05 LAB — RHEUMATOID FACTOR: Rhuematoid fact SerPl-aCnc: 13.6 IU/mL (ref 0.0–13.9)

## 2019-08-05 LAB — MICROSCOPIC EXAMINATION
Bacteria, UA: NONE SEEN
Casts: NONE SEEN /lpf
Epithelial Cells (non renal): NONE SEEN /hpf (ref 0–10)

## 2019-08-05 LAB — HIV ANTIBODY (ROUTINE TESTING W REFLEX): HIV Screen 4th Generation wRfx: NONREACTIVE

## 2019-08-05 LAB — HEPATITIS C ANTIBODY (REFLEX): HCV Ab: >11 s/co ratio — ABNORMAL HIGH (ref 0.0–0.9)

## 2019-08-05 LAB — SEDIMENTATION RATE: Sed Rate: 8 mm/hr (ref 0–30)

## 2019-08-05 NOTE — Progress Notes (Addendum)
Integrated Behavioral Health Visit via Telemedicine (Telephone)  08/05/2019 AZARIAN MCGUIGAN [id number]   Session Start time: 8:40am  Session End time: 9:02am Total time: 20  Referring Provider: Dr. Joya Gaskins Type of Visit: Telephonic Patient location: [facility] Provider location: [facility] Office All persons participating in visit: MSW intern and patient  Confirmed patient's address: Yes  Confirmed patient's phone number: Yes  Any changes to demographics: No   Confirmed patient's insurance: Yes  Any changes to patient's insurance: No   Discussed confidentiality: Yes    The following statements were read to the patient and/or legal guardian that are established with the [facility] Provider.  "The purpose of this phone visit is to provide behavioral health care while limiting exposure to the coronavirus (COVID19).  There is a possibility of technology failure and discussed alternative modes of communication if that failure occurs."  "By engaging in this telephone visit, you consent to the provision of healthcare.  Additionally, you authorize for your insurance to be billed for the services provided during this telephone visit."   Patient and/or legal guardian consented to telephone visit: Yes   PRESENTING CONCERNS: Patient and/or family reports the following symptoms/concerns: Pt reports interest in transferring medication assisted treatment care from [facility] to [place].  Duration of problem: Ongoing; Severity of problem: moderate  STRENGTHS (Protective Factors/Coping Skills): Patient has decreased his tobacco use Patient has access to transportation Patient participates in medication assisted treatment   GOALS ADDRESSED: Patient will: 1.  Reduce symptoms of: stress  2.  Increase knowledge and/or ability of: coping skills and self-management skills  3.  Demonstrate ability to: Increase adequate support systems for patient/family  INTERVENTIONS: Interventions  utilized:  Solution-Focused Strategies, Supportive Counseling, Medication Monitoring and Link to [employer] Standardized Assessments completed: Not Needed  ASSESSMENT: Patient currently experiencing stress due to the financial cost and required travel time for patient to obtain medication assisted treatment (MAT) in [place]. Patient has received MAT from [facility] in [place] for the past year and reports no illicit substance use. Patient would like to transfer MAT services to [facility] with a preference for Buprenorphine.  Patient may benefit from establishing services at [facility] in [place]. This MSW intern provided patient with contact information and details to transfer MAT to [facility]. This MSW intern additionally encouraged patient to complete [facility] Financial Assistance Application to assist in following up with specialty providers for medical care.   PLAN: 1. Follow up with behavioral health clinician on : Patient encouraged to contact this MSW intern to inform her of referral outcome. 2. Behavioral recommendations: Establish services with [facility] and complete [facility]. 3. Referral(s): [facility] Abuse [facility] for Medication [place]  MSW intern 08/05/2019 2:44pm

## 2019-08-07 ENCOUNTER — Telehealth (INDEPENDENT_AMBULATORY_CARE_PROVIDER_SITE_OTHER): Payer: Self-pay

## 2019-08-07 NOTE — Telephone Encounter (Signed)
-----   Message from Elsie Stain, MD sent at 08/05/2019  2:31 PM EST ----- See prior result note

## 2019-08-07 NOTE — Telephone Encounter (Signed)
Patient date of birth verified. He is aware that he does NOT have rheumatoid arthritis. HIV negative. Labs show evidence of Hepatitis C exposure. Needs follow up lab draw. Advised patient to contact [facility] to schedule lab appt. UA normal, CBC normal. Some mild liver function abnormalities; please refrain from alcohol. He verbalized understanding of results. Nat Christen, CMA

## 2019-09-13 NOTE — Progress Notes (Deleted)
Subjective:    Patient ID: Jose Brooks, male    DOB: Oct 07, 1960, 59 y.o.   MRN: [id number]  59 y.o.M who just moved to the [place] area from [place] in November 2020.  The patient has a prior history of renal cancer with a left kidney removed in January 2020.  The patient is living with his ex-girlfriend and works on a utility pole cabling.  The patient has a history of hypertension but has been off   ?all medications since he moved from [place].  He smokes a pack a day of cigarettes.  He does have longstanding hypertension.  He complains now of increased wrist and hand pain and also some ankle pain.  His finger stay numb and eyeglass will slip through his hands.  He has difficulty with his pain in his wrist as well.  He went to urgent care recently was prescribed prednisone with some relief of the pain.  Note the patient is self-pay and needs financial assistance. The patient denies any chest pain or shortness of breath.  The patient has no cough.  There is no edema.  Note the patient is on Subutex and receives this from a substance use clinic where he is receiving medication assisted therapy for chronic opioid use.   He is desiring to transfer his care to a clinic in the [place] area as he currently goes to [facility] with this care  09/15/2019 Essential hypertension Hypertension now with blood pressure 145/86  Plan will be to begin losartan HCT 100/25 1 daily  We will follow-up with metabolic panel and blood counts    Bilateral carpal tunnel syndrome Probable progressive bilateral carpal tunnel syndrome  Will need to make referrals to orthopedics however before doing this the patient will need obtain financial assistance will be applying for [place] financial discounts and the orange card  We will give pulse dose of prednisone in the interim  Polyarthritis The patient symptom complex is not consistent with rheumatoid arthritis and there  are no physical exam findings compatible with rheumatoid arthritis nevertheless I will check rheumatoid factor sed rate  History of renal cell cancer History of renal cell cancer with nephrectomy will follow up with urinalysis and will yet need to see urology once he obtains financial assistance  ??financial support??     Subutex source?? Local??  Past Medical History:  Diagnosis Date  . Arthritis   . Headache    occasionally lately   . History of kidney stones   . Mosquito bite    says a virus was introduced with bite [place] [facility] [place]      Family History  Problem Relation Age of Onset  . Nephrolithiasis Brother      Social History   Socioeconomic History  . Marital status: Single    Spouse name: Not on file  . Number of children: Not on file  . Years of education: Not on file  . Highest education level: Not on file  Occupational History  . Not on file  Tobacco Use  . Smoking status: Current Every Day Smoker    Packs/day: 1.00    Years: 40.00    Pack years: 40.00    Types: Cigarettes  . Smokeless tobacco: Never Used  Substance and Sexual Activity  . Alcohol use: Yes    Alcohol/week: 7.0 standard drinks    Types: 7 Glasses of wine per week    Comment: daily  . Drug use: Yes  Types: Marijuana, Cocaine    Comment: last six months ago   . Sexual activity: Yes    Birth control/protection: None  Other Topics Concern  . Not on file  Social History Narrative  . Not on file   Social Determinants of Health   Financial Resource Strain:   . Difficulty of Paying Living Expenses:   Food Insecurity:   . Worried About Charity fundraiser in the Last Year:   . Arboriculturist in the Last Year:   Transportation Needs:   . Film/video editor (Medical):   Marland Kitchen Lack of Transportation (Non-Medical):   Physical Activity:   . Days of Exercise per Week:   . Minutes of Exercise per Session:   Stress:   . Feeling of Stress :   Social Connections:   .  Frequency of Communication with Friends and Family:   . Frequency of Social Gatherings with Friends and Family:   . Attends Religious Services:   . Active Member of Clubs or Organizations:   . Attends Archivist Meetings:   Marland Kitchen Marital Status:   Intimate Partner Violence:   . Fear of Current or Ex-Partner:   . Emotionally Abused:   Marland Kitchen Physically Abused:   . Sexually Abused:      Allergies  Allergen Reactions  . Hydrocodone Itching     Outpatient Medications Prior to Visit  Medication Sig Dispense Refill  . buprenorphine (SUBUTEX) 8 MG SUBL SL tablet Place 8 mg under the tongue daily.    Marland Kitchen losartan-hydrochlorothiazide (HYZAAR) 100-25 MG tablet Take 1 tablet by mouth daily. 30 tablet 3  . predniSONE (DELTASONE) 10 MG tablet Take 4 tablets daily for 5 days then stop 20 tablet 0   No facility-administered medications prior to visit.       Review of Systems Constitutional:   No  weight loss, night sweats,  Fevers, chills, fatigue, lassitude. HEENT:   No headaches,  Difficulty swallowing,  Tooth/dental problems,  Sore throat,                No sneezing, itching, ear ache, nasal congestion, post nasal drip,   CV:  No chest pain,  Orthopnea, PND, swelling in lower extremities, anasarca, dizziness, palpitations  GI  No heartburn, indigestion, abdominal pain, nausea, vomiting, diarrhea, change in bowel habits, loss of appetite  Resp: No shortness of breath with exertion or at rest.  No excess mucus, no productive cough,  No non-productive cough,  No coughing up of blood.  No change in color of mucus.  No wheezing.  No chest wall deformity  Skin: no rash or lesions.  GU: no dysuria, change in color of urine, no urgency or frequency.  No flank pain.  MS:   joint pain or swelling.   decreased range of motion.  No back pain.  Psych:  No change in mood or affect.  depression or anxiety.  No memory loss.     Objective:   Physical Exam There were no vitals filed for this  visit.  Gen: Pleasant, well-nourished, in no distress,  normal affect  ENT: No lesions,  mouth clear,  oropharynx clear, no postnasal drip  Neck: No JVD, no TMG, no carotid bruits  Lungs: No use of accessory muscles, no dullness to percussion, clear without rales or rhonchi  Cardiovascular: RRR, heart sounds normal, no murmur or gallops, no peripheral edema  Abdomen: soft and NT, no HSM,  BS normal  Musculoskeletal: tender both wrists plantar aspect.  Decreased rom in left wrist.   Decreased sensation both palmar areas of hands.  Full rom of ankles, no edema, no deformity of ankles  Neuro: alert, non focal  Skin: Warm, no lesions or rashes  No results found.       Assessment & Plan:  I personally reviewed all images and lab data in the [facility] system as well as any outside material available during this office visit and agree with the  radiology impressions.   No problem-specific Assessment & Plan notes found for this encounter.   There are no diagnoses linked to this encounter.  We will screen for hepatitis C and HIV

## 2019-09-15 ENCOUNTER — Ambulatory Visit (HOSPITAL_BASED_OUTPATIENT_CLINIC_OR_DEPARTMENT_OTHER): Payer: Self-pay | Admitting: Critical Care Medicine

## 2019-09-15 DIAGNOSIS — Z1211 Encounter for screening for malignant neoplasm of colon: Secondary | ICD-10-CM
